# Patient Record
Sex: Female | Born: 1965 | Race: White | Hispanic: No | Marital: Married | Smoking: Never smoker
Health system: Southern US, Community
[De-identification: ages and names within clinical notes are randomized; demographics above are authoritative.]

## PROBLEM LIST (undated history)

## (undated) DIAGNOSIS — M109 Gout, unspecified: Secondary | ICD-10-CM

## (undated) DIAGNOSIS — D649 Anemia, unspecified: Secondary | ICD-10-CM

## (undated) DIAGNOSIS — N61 Mastitis without abscess: Secondary | ICD-10-CM

## (undated) DIAGNOSIS — G5600 Carpal tunnel syndrome, unspecified upper limb: Secondary | ICD-10-CM

## (undated) DIAGNOSIS — N6009 Solitary cyst of unspecified breast: Secondary | ICD-10-CM

## (undated) HISTORY — DX: Solitary cyst of unspecified breast: N60.09

## (undated) HISTORY — DX: Gout, unspecified: M10.9

## (undated) HISTORY — DX: Carpal tunnel syndrome, unspecified upper limb: G56.00

## (undated) HISTORY — DX: Mastitis without abscess: N61.0

---

## 2010-06-20 HISTORY — PX: BREAST CYST ASPIRATION: SHX578

## 2012-11-19 ENCOUNTER — Ambulatory Visit: Payer: Self-pay

## 2012-12-24 ENCOUNTER — Encounter: Payer: Self-pay | Admitting: *Deleted

## 2012-12-25 ENCOUNTER — Ambulatory Visit: Payer: Self-pay | Admitting: General Surgery

## 2012-12-31 ENCOUNTER — Ambulatory Visit (INDEPENDENT_AMBULATORY_CARE_PROVIDER_SITE_OTHER): Payer: BC Managed Care – PPO | Admitting: General Surgery

## 2012-12-31 ENCOUNTER — Encounter: Payer: Self-pay | Admitting: General Surgery

## 2012-12-31 VITALS — BP 100/60 | HR 62 | Resp 13 | Ht 62.0 in | Wt 108.0 lb

## 2012-12-31 DIAGNOSIS — N6001 Solitary cyst of right breast: Secondary | ICD-10-CM

## 2012-12-31 DIAGNOSIS — N6009 Solitary cyst of unspecified breast: Secondary | ICD-10-CM | POA: Insufficient documentation

## 2012-12-31 NOTE — Patient Instructions (Addendum)
Patient to return as needed. 

## 2012-12-31 NOTE — Progress Notes (Addendum)
Patient ID: Patricia Mercado, female   DOB: May 30, 1966, 47 y.o.   MRN: [id number]  Chief Complaint  Patient presents with  . Breast Problem    category 2 mammogram rigth breast mass    HPI Patricia Mercado is a 47 y.o. female who presents for a breast evaluation. The most recent mammogram was done on 11/19/12 with a birad category 2. A right breast ultrasound was performed at that time as well. The patient states she noticed a lump in the right breast last fall. She states she had a mammogram done then and it was normal. She mentioned this lump to Dr. Luella Cook in May 2014 which prompted her mammogram in June 2014. The lump is located in the right upper outer quadrant of the right breast. She denies any pain or tenderness with the breasts.  The patient was previously evaluated in August 2012 in regards to bilateral breast cyst, right at that time measuring up to 1.4 cm and a complex cyst in the left. Cytology on the left breast lesion was benign.  HPI  Past Medical History  Diagnosis Date  . Mastitis   . Breast cyst 2012, 2014    right breast    Past Surgical History  Procedure Laterality Date  . Breast cyst aspiration Left 2012    Family History  Problem Relation Age of Onset  . Cancer Maternal Grandfather     colon    Social History History  Substance Use Topics  . Smoking status: Never Smoker   . Smokeless tobacco: Never Used  . Alcohol Use: No    No Known Allergies  No current outpatient prescriptions on file.   No current facility-administered medications for this visit.    Review of Systems Review of Systems  Constitutional: Negative.   Respiratory: Negative.   Cardiovascular: Negative.     Blood pressure 100/60, pulse 62, resp. rate 13, height 5\' 2"  (1.575 m), weight 108 lb (48.988 kg), last menstrual period 12/15/2012.  Physical Exam Physical Exam  Constitutional: She is oriented to person, place, and time. She appears well-developed and well-nourished.  Neck: No  thyromegaly present.  Cardiovascular: Normal rate, regular rhythm and normal heart sounds.   No murmur heard. Pulmonary/Chest: Effort normal and breath sounds normal. Right breast exhibits mass. Right breast exhibits no inverted nipple, no nipple discharge, no skin change and no tenderness. Left breast exhibits mass. Left breast exhibits no inverted nipple, no nipple discharge, no skin change and no tenderness. Breasts are symmetrical.  2 cm cyst right breast upper outer quadrant.   1 cm area left breast 12 o'clock.   Lymphadenopathy:    She has no cervical adenopathy.    She has no axillary adenopathy.  Neurological: She is alert and oriented to person, place, and time.  Skin: Skin is warm and dry.    Data Reviewed The lateral right breast mammogram dated November 19, 2012 showed dense breast. Well-circumscribed mass in the upper-outer quadrant. Ultrasound showed multiple cysts, the dominant lesion measuring up to 2.6 cm diameter. The cyst in the upper or quadrant of the right breast is larger than on her 2012 exam, but is asymptomatic.  Assessment    Asymptomatic breast cysts.     Plan    The patient is not having any discomfort in her breast, and aspiration is not indicated for these benign lesions. Should she notice pain or experienced sudden change in size, she was encouraged to call for reassessment.        Patricia Mercado,  Patricia Mercado 01/01/2013, 8:47 PM

## 2013-01-01 ENCOUNTER — Encounter: Payer: Self-pay | Admitting: General Surgery

## 2013-04-15 ENCOUNTER — Ambulatory Visit: Payer: Self-pay

## 2014-04-21 ENCOUNTER — Encounter: Payer: Self-pay | Admitting: General Surgery

## 2014-04-22 ENCOUNTER — Ambulatory Visit: Payer: Self-pay

## 2015-04-16 ENCOUNTER — Other Ambulatory Visit: Payer: Self-pay | Admitting: Unknown Physician Specialty

## 2015-04-16 DIAGNOSIS — Z1231 Encounter for screening mammogram for malignant neoplasm of breast: Secondary | ICD-10-CM

## 2015-04-24 ENCOUNTER — Ambulatory Visit
Admission: RE | Admit: 2015-04-24 | Discharge: 2015-04-24 | Disposition: A | Discharge status: Home / Self Care | Payer: BC Managed Care – PPO | Source: Ambulatory Visit | Attending: Unknown Physician Specialty | Admitting: Unknown Physician Specialty

## 2015-04-24 DIAGNOSIS — Z1231 Encounter for screening mammogram for malignant neoplasm of breast: Secondary | ICD-10-CM | POA: Diagnosis present

## 2016-05-20 ENCOUNTER — Other Ambulatory Visit: Payer: Self-pay | Admitting: Advanced Practice Midwife

## 2016-05-20 DIAGNOSIS — Z1231 Encounter for screening mammogram for malignant neoplasm of breast: Secondary | ICD-10-CM

## 2016-06-20 HISTORY — PX: OTHER SURGICAL HISTORY: SHX169

## 2016-06-27 ENCOUNTER — Ambulatory Visit: Payer: BC Managed Care – PPO

## 2016-07-19 ENCOUNTER — Other Ambulatory Visit: Payer: Self-pay | Admitting: Advanced Practice Midwife

## 2016-07-19 ENCOUNTER — Ambulatory Visit
Admission: RE | Admit: 2016-07-19 | Discharge: 2016-07-19 | Disposition: A | Discharge status: Home / Self Care | Payer: BC Managed Care – PPO | Source: Ambulatory Visit | Attending: Advanced Practice Midwife | Admitting: Advanced Practice Midwife

## 2016-07-19 DIAGNOSIS — Z1231 Encounter for screening mammogram for malignant neoplasm of breast: Secondary | ICD-10-CM | POA: Insufficient documentation

## 2017-08-07 ENCOUNTER — Other Ambulatory Visit: Payer: Self-pay | Admitting: Certified Nurse Midwife

## 2017-08-18 ENCOUNTER — Encounter: Payer: Self-pay | Admitting: Advanced Practice Midwife

## 2017-08-18 ENCOUNTER — Ambulatory Visit (INDEPENDENT_AMBULATORY_CARE_PROVIDER_SITE_OTHER): Payer: BC Managed Care – PPO | Admitting: Advanced Practice Midwife

## 2017-08-18 VITALS — BP 100/60 | HR 64 | Ht 63.0 in | Wt 109.0 lb

## 2017-08-18 DIAGNOSIS — Z1231 Encounter for screening mammogram for malignant neoplasm of breast: Secondary | ICD-10-CM | POA: Diagnosis not present

## 2017-08-18 DIAGNOSIS — Z1239 Encounter for other screening for malignant neoplasm of breast: Secondary | ICD-10-CM

## 2017-08-18 DIAGNOSIS — Z0000 Encounter for general adult medical examination without abnormal findings: Secondary | ICD-10-CM

## 2017-08-18 NOTE — Patient Instructions (Signed)
American Heart Association (AHA) Exercise Recommendation  Being physically active is important to prevent heart disease and stroke, the nation's No. 1and No. 5killers. To improve overall cardiovascular health, we suggest at least 150 minutes per week of moderate exercise or 75 minutes per week of vigorous exercise (or a combination of moderate and vigorous activity). Thirty minutes a day, five times a week is an easy goal to remember. You will also experience benefits even if you divide your time into two or three segments of 10 to 15 minutes per day.  For people who would benefit from lowering their blood pressure or cholesterol, we recommend 40 minutes of aerobic exercise of moderate to vigorous intensity three to four times a week to lower the risk for heart attack and stroke.  Physical activity is anything that makes you move your body and burn calories.  This includes things like climbing stairs or playing sports. Aerobic exercises benefit your heart, and include walking, jogging, swimming or biking. Strength and stretching exercises are best for overall stamina and flexibility.  The simplest, positive change you can make to effectively improve your heart health is to start walking. It's enjoyable, free, easy, social and great exercise. A walking program is flexible and boasts high success rates because people can stick with it. It's easy for walking to become a regular and satisfying part of life.   For Overall Cardiovascular Health:  At least 30 minutes of moderate-intensity aerobic activity at least 5 days per week for a total of 150  OR   At least 25 minutes of vigorous aerobic activity at least 3 days per week for a total of 75 minutes; or a combination of moderate- and vigorous-intensity aerobic activity  AND   Moderate- to high-intensity muscle-strengthening activity at least 2 days per week for additional health benefits.  For Lowering Blood Pressure and Cholesterol  An  average 40 minutes of moderate- to vigorous-intensity aerobic activity 3 or 4 times per week  What if I can't make it to the time goal? Something is always better than nothing! And everyone has to start somewhere. Even if you've been sedentary for years, today is the day you can begin to make healthy changes in your life. If you don't think you'll make it for 30 or 40 minutes, set a reachable goal for today. You can work up toward your overall goal by increasing your time as you get stronger. Don't let all-or-nothing thinking rob you of doing what you can every day.  Source:http://www.heart.org   Mediterranean Diet A Mediterranean diet refers to food and lifestyle choices that are based on the traditions of countries located on the [employer]. This way of eating has been shown to help prevent certain conditions and improve outcomes for people who have chronic diseases, like kidney disease and heart disease. What are tips for following this plan? Lifestyle  Cook and eat meals together with your family, when possible.  Drink enough fluid to keep your urine clear or pale yellow.  Be physically active every day. This includes: ? Aerobic exercise like running or swimming. ? Leisure activities like gardening, walking, or housework.  Get 7-8 hours of sleep each night.  If recommended by your health care provider, drink red wine in moderation. This means 1 glass a day for nonpregnant women and 2 glasses a day for men. A glass of wine equals 5 oz (150 mL). Reading food labels  Check the serving size of packaged foods. For foods such as rice  and pasta, the serving size refers to the amount of cooked product, not dry.  Check the total fat in packaged foods. Avoid foods that have saturated fat or trans fats.  Check the ingredients list for added sugars, such as corn syrup. Shopping  At the grocery store, buy most of your food from the areas near the walls of the store. This  includes: ? Fresh fruits and vegetables (produce). ? Grains, beans, nuts, and seeds. Some of these may be available in unpackaged forms or large amounts (in bulk). ? Fresh seafood. ? Poultry and eggs. ? Low-fat dairy products.  Buy whole ingredients instead of prepackaged foods.  Buy fresh fruits and vegetables in-season from local farmers markets.  Buy frozen fruits and vegetables in resealable bags.  If you do not have access to quality fresh seafood, buy precooked frozen shrimp or canned fish, such as tuna, salmon, or sardines.  Buy small amounts of raw or cooked vegetables, salads, or olives from the deli or salad bar at your store.  Stock your pantry so you always have certain foods on hand, such as olive oil, canned tuna, canned tomatoes, rice, pasta, and beans. Cooking  Cook foods with extra-virgin olive oil instead of using butter or other vegetable oils.  Have meat as a side dish, and have vegetables or grains as your main dish. This means having meat in small portions or adding small amounts of meat to foods like pasta or stew.  Use beans or vegetables instead of meat in common dishes like chili or lasagna.  Experiment with different cooking methods. Try roasting or broiling vegetables instead of steaming or sauteing them.  Add frozen vegetables to soups, stews, pasta, or rice.  Add nuts or seeds for added healthy fat at each meal. You can add these to yogurt, salads, or vegetable dishes.  Marinate fish or vegetables using olive oil, lemon juice, garlic, and fresh herbs. Meal planning  Plan to eat 1 vegetarian meal one day each week. Try to work up to 2 vegetarian meals, if possible.  Eat seafood 2 or more times a week.  Have healthy snacks readily available, such as: ? Vegetable sticks with hummus. ? [place] yogurt. ? Fruit and nut trail mix.  Eat balanced meals throughout the week. This includes: ? Fruit: 2-3 servings a day ? Vegetables: 4-5 servings a  day ? Low-fat dairy: 2 servings a day ? Fish, poultry, or lean meat: 1 serving a day ? Beans and legumes: 2 or more servings a week ? Nuts and seeds: 1-2 servings a day ? Whole grains: 6-8 servings a day ? Extra-virgin olive oil: 3-4 servings a day  Limit red meat and sweets to only a few servings a month What are my food choices?  Mediterranean diet ? Recommended ? Grains: Whole-grain pasta. Brown rice. Bulgar wheat. Polenta. Couscous. Whole-wheat bread. [place]. ? Vegetables: Artichokes. Beets. Broccoli. Cabbage. Carrots. Eggplant. Green beans. Chard. Kale. Spinach. Onions. Leeks. Peas. Squash. Tomatoes. Peppers. Radishes. ? Fruits: Apples. Apricots. Avocado. Berries. Bananas. Cherries. Dates. Figs. Grapes. Lemons. Melon. Oranges. Peaches. Plums. Pomegranate. ? Meats and other protein foods: Beans. Almonds. Sunflower seeds. Pine nuts. Peanuts. [place]. Salmon. Scallops. Shrimp. [place]. Tilapia. Clams. Oysters. Eggs. ? Dairy: Low-fat milk. Cheese. Greek yogurt. ? Beverages: Water. Red wine. Herbal tea. ? Fats and oils: Extra virgin olive oil. Avocado oil. Grape seed oil. ? Sweets and desserts: [place] yogurt with honey. Baked apples. Poached pears. Trail mix. ? Seasoning and other foods: Basil. Cilantro. Coriander.  Cumin. Mint. Parsley. Sage. Rosemary. Tarragon. Garlic. Oregano. Thyme. Pepper. Balsalmic vinegar. Tahini. Hummus. Tomato sauce. Olives. Mushrooms. ? Limit these ? Grains: Prepackaged pasta or rice dishes. Prepackaged cereal with added sugar. ? Vegetables: Deep fried potatoes (french fries). ? Fruits: Fruit canned in syrup. ? Meats and other protein foods: Beef. Pork. Lamb. Poultry with skin. Hot dogs. Bacon. ? Dairy: Ice cream. Sour cream. Whole milk. ? Beverages: Juice. Sugar-sweetened soft drinks. Beer. Liquor and spirits. ? Fats and oils: Butter. Canola oil. Vegetable oil. Beef fat (tallow). Lard. ? Sweets and desserts: Cookies. Cakes. Pies. Candy. ? Seasoning and other  foods: Mayonnaise. Premade sauces and marinades. ? The items listed may not be a complete list. Talk with your dietitian about what dietary choices are right for you. Summary  The Mediterranean diet includes both food and lifestyle choices.  Eat a variety of fresh fruits and vegetables, beans, nuts, seeds, and whole grains.  Limit the amount of red meat and sweets that you eat.  Talk with your health care provider about whether it is safe for you to drink red wine in moderation. This means 1 glass a day for nonpregnant women and 2 glasses a day for men. A glass of wine equals 5 oz (150 mL). This information is not intended to replace advice given to you by your health care provider. Make sure you discuss any questions you have with your health care provider. Document Released: 01/28/2016 Document Revised: 03/01/2016 Document Reviewed: 01/28/2016 Elsevier Interactive Patient Education  2018 [employer] Health Maintenance, Female Adopting a healthy lifestyle and getting preventive care can go a long way to promote health and wellness. Talk with your health care provider about what schedule of regular examinations is right for you. This is a good chance for you to check in with your provider about disease prevention and staying healthy. In between checkups, there are plenty of things you can do on your own. Experts have done a lot of research about which lifestyle changes and preventive measures are most likely to keep you healthy. Ask your health care provider for more information. Weight and diet Eat a healthy diet  Be sure to include plenty of vegetables, fruits, low-fat dairy products, and lean protein.  Do not eat a lot of foods high in solid fats, added sugars, or salt.  Get regular exercise. This is one of the most important things you can do for your health. ? Most adults should exercise for at least 150 minutes each week. The exercise should increase your heart rate and make you  sweat (moderate-intensity exercise). ? Most adults should also do strengthening exercises at least twice a week. This is in addition to the moderate-intensity exercise.  Maintain a healthy weight  Body mass index (BMI) is a measurement that can be used to identify possible weight problems. It estimates body fat based on height and weight. Your health care provider can help determine your BMI and help you achieve or maintain a healthy weight.  For females 20 years of age and older: ? A BMI below 18.5 is considered underweight. ? A BMI of 18.5 to 24.9 is normal. ? A BMI of 25 to 29.9 is considered overweight. ? A BMI of 30 and above is considered obese.  Watch levels of cholesterol and blood lipids  You should start having your blood tested for lipids and cholesterol at 50 years of age, then have this test every 5 years.  You may need to have your cholesterol levels   checked more often if: ? Your lipid or cholesterol levels are high. ? You are older than 50 years of age. ? You are at high risk for heart disease.  Cancer screening Lung Cancer  Lung cancer screening is recommended for adults 34-81 years old who are at high risk for lung cancer because of a history of smoking.  A yearly low-dose CT scan of the lungs is recommended for people who: ? Currently smoke. ? Have quit within the past 15 years. ? Have at least a 30-pack-year history of smoking. A pack year is smoking an average of one pack of cigarettes a day for 1 year.  Yearly screening should continue until it has been 15 years since you quit.  Yearly screening should stop if you develop a health problem that would prevent you from having lung cancer treatment.  Breast Cancer  Practice breast self-awareness. This means understanding how your breasts normally appear and feel.  It also means doing regular breast self-exams. Let your health care provider know about any changes, no matter how small.  If you are in your 20s  or 30s, you should have a clinical breast exam (CBE) by a health care provider every 1-3 years as part of a regular health exam.  If you are 62 or older, have a CBE every year. Also consider having a breast X-ray (mammogram) every year.  If you have a family history of breast cancer, talk to your health care provider about genetic screening.  If you are at high risk for breast cancer, talk to your health care provider about having an MRI and a mammogram every year.  Breast cancer gene (BRCA) assessment is recommended for women who have family members with BRCA-related cancers. BRCA-related cancers include: ? Breast. ? Ovarian. ? Tubal. ? Peritoneal cancers.  Results of the assessment will determine the need for genetic counseling and BRCA1 and BRCA2 testing.  Cervical Cancer Your health care provider may recommend that you be screened regularly for cancer of the pelvic organs (ovaries, uterus, and vagina). This screening involves a pelvic examination, including checking for microscopic changes to the surface of your cervix (Pap test). You may be encouraged to have this screening done every 3 years, beginning at age 8.  For women ages 52-65, health care providers may recommend pelvic exams and Pap testing every 3 years, or they may recommend the Pap and pelvic exam, combined with testing for human papilloma virus (HPV), every 5 years. Some types of HPV increase your risk of cervical cancer. Testing for HPV may also be done on women of any age with unclear Pap test results.  Other health care providers may not recommend any screening for nonpregnant women who are considered low risk for pelvic cancer and who do not have symptoms. Ask your health care provider if a screening pelvic exam is right for you.  If you have had past treatment for cervical cancer or a condition that could lead to cancer, you need Pap tests and screening for cancer for at least 20 years after your treatment. If Pap tests  have been discontinued, your risk factors (such as having a new sexual partner) need to be reassessed to determine if screening should resume. Some women have medical problems that increase the chance of getting cervical cancer. In these cases, your health care provider may recommend more frequent screening and Pap tests.  Colorectal Cancer  This type of cancer can be detected and often prevented.  Routine colorectal cancer screening  usually begins at 50 years of age and continues through 50 years of age.  Your health care provider may recommend screening at an earlier age if you have risk factors for colon cancer.  Your health care provider may also recommend using home test kits to check for hidden blood in the stool.  A small camera at the end of a tube can be used to examine your colon directly (sigmoidoscopy or colonoscopy). This is done to check for the earliest forms of colorectal cancer.  Routine screening usually begins at age 8.  Direct examination of the colon should be repeated every 5-10 years through 50 years of age. However, you may need to be screened more often if early forms of precancerous polyps or small growths are found.  Skin Cancer  Check your skin from head to toe regularly.  Tell your health care provider about any new moles or changes in moles, especially if there is a change in a mole's shape or color.  Also tell your health care provider if you have a mole that is larger than the size of a pencil eraser.  Always use sunscreen. Apply sunscreen liberally and repeatedly throughout the day.  Protect yourself by wearing long sleeves, pants, a wide-brimmed hat, and sunglasses whenever you are outside.  Heart disease, diabetes, and high blood pressure  High blood pressure causes heart disease and increases the risk of stroke. High blood pressure is more likely to develop in: ? People who have blood pressure in the high end of the normal range (130-139/85-89 mm  Hg). ? People who are overweight or obese. ? People who are African American.  If you are 29-23 years of age, have your blood pressure checked every 3-5 years. If you are 68 years of age or older, have your blood pressure checked every year. You should have your blood pressure measured twice-once when you are at a hospital or clinic, and once when you are not at a hospital or clinic. Record the average of the two measurements. To check your blood pressure when you are not at a hospital or clinic, you can use: ? An automated blood pressure machine at a pharmacy. ? A home blood pressure monitor.  If you are between 82 years and 71 years old, ask your health care provider if you should take aspirin to prevent strokes.  Have regular diabetes screenings. This involves taking a blood sample to check your fasting blood sugar level. ? If you are at a normal weight and have a low risk for diabetes, have this test once every three years after 50 years of age. ? If you are overweight and have a high risk for diabetes, consider being tested at a younger age or more often. Preventing infection Hepatitis B  If you have a higher risk for hepatitis B, you should be screened for this virus. You are considered at high risk for hepatitis B if: ? You were born in a country where hepatitis B is common. Ask your health care provider which countries are considered high risk. ? Your parents were born in a high-risk country, and you have not been immunized against hepatitis B (hepatitis B vaccine). ? You have HIV or AIDS. ? You use needles to inject street drugs. ? You live with someone who has hepatitis B. ? You have had sex with someone who has hepatitis B. ? You get hemodialysis treatment. ? You take certain medicines for conditions, including cancer, organ transplantation, and autoimmune conditions.  Hepatitis C  Blood testing is recommended for: ? Everyone born from [birth year] through 1963. ? Anyone with known  risk factors for hepatitis C.  Sexually transmitted infections (STIs)  You should be screened for sexually transmitted infections (STIs) including gonorrhea and chlamydia if: ? You are sexually active and are younger than 50 years of age. ? You are older than 50 years of age and your health care provider tells you that you are at risk for this type of infection. ? Your sexual activity has changed since you were last screened and you are at an increased risk for chlamydia or gonorrhea. Ask your health care provider if you are at risk.  If you do not have HIV, but are at risk, it may be recommended that you take a prescription medicine daily to prevent HIV infection. This is called pre-exposure prophylaxis (PrEP). You are considered at risk if: ? You are sexually active and do not regularly use condoms or know the HIV status of your partner(s). ? You take drugs by injection. ? You are sexually active with a partner who has HIV.  Talk with your health care provider about whether you are at high risk of being infected with HIV. If you choose to begin PrEP, you should first be tested for HIV. You should then be tested every 3 months for as long as you are taking PrEP. Pregnancy  If you are premenopausal and you may become pregnant, ask your health care provider about preconception counseling.  If you may become pregnant, take 400 to 800 micrograms (mcg) of folic acid every day.  If you want to prevent pregnancy, talk to your health care provider about birth control (contraception). Osteoporosis and menopause  Osteoporosis is a disease in which the bones lose minerals and strength with aging. This can result in serious bone fractures. Your risk for osteoporosis can be identified using a bone density scan.  If you are 74 years of age or older, or if you are at risk for osteoporosis and fractures, ask your health care provider if you should be screened.  Ask your health care provider whether you  should take a calcium or vitamin D supplement to lower your risk for osteoporosis.  Menopause may have certain physical symptoms and risks.  Hormone replacement therapy may reduce some of these symptoms and risks. Talk to your health care provider about whether hormone replacement therapy is right for you. Follow these instructions at home:  Schedule regular health, dental, and eye exams.  Stay current with your immunizations.  Do not use any tobacco products including cigarettes, chewing tobacco, or electronic cigarettes.  If you are pregnant, do not drink alcohol.  If you are breastfeeding, limit how much and how often you drink alcohol.  Limit alcohol intake to no more than 1 drink per day for nonpregnant women. One drink equals 12 ounces of beer, 5 ounces of wine, or 1 ounces of hard liquor.  Do not use street drugs.  Do not share needles.  Ask your health care provider for help if you need support or information about quitting drugs.  Tell your health care provider if you often feel depressed.  Tell your health care provider if you have ever been abused or do not feel safe at home. This information is not intended to replace advice given to you by your health care provider. Make sure you discuss any questions you have with your health care provider. Document Released: 12/20/2010 Document Revised: 11/12/2015 Document Reviewed:  03/10/2015 Elsevier Interactive Patient Education  [employer].

## 2017-08-18 NOTE — Progress Notes (Signed)
Patient ID: [name], female   DOB: 1965/10/15, 52 y.o.   MRN: [id number]    Gynecology Annual Exam  PCP: Patricia Downer, NP  Chief Complaint:  Chief Complaint  Patient presents with  . Gynecologic Exam    History of Present Illness: Patient is a 52 y.o. [id number] presents for annual exam. The patient has no complaints today.   LMP: Patient's last menstrual period was 08/04/2017. Average Interval: regular, 28 days Duration of flow: 4 days Heavy Menses: no Clots: no Intermenstrual Bleeding: no Postcoital Bleeding: no Dysmenorrhea: no   The patient is sexually active. She currently uses condoms for contraception. She denies dyspareunia.  The patient does occasionally perform self breast exams.  There is no notable family history of breast or ovarian cancer in her family.  The patient wears seatbelts: yes.   The patient has regular exercise: no.  She occasionally walks. She admits healthy diet. She drinks diet soda more than water. She admits good social support system.  The patient denies current symptoms of depression.    Review of Systems: Review of Systems  Constitutional: Negative.   HENT: Negative.   Eyes: Negative.   Respiratory: Negative.   Cardiovascular: Negative.   Gastrointestinal: Negative.   Genitourinary: Negative.   Musculoskeletal: Negative.   Skin: Negative.   Neurological: Negative.   Endo/Heme/Allergies: Negative.   Psychiatric/Behavioral: Negative.     Past Medical History:  Past Medical History:  Diagnosis Date  . Breast cyst 2012, 2014   right breast  . Carpal tunnel syndrome   . Mastitis     Past Surgical History:  Past Surgical History:  Procedure Laterality Date  . BREAST CYST ASPIRATION Left 2012    Gynecologic History:  Patient's last menstrual period was 08/04/2017. Contraception: condoms Last Pap: 2 years ago Results were:  no abnormalities  Last mammogram: 1 year ago Results were: BI-RAD I  Obstetric History:  [id number]  Family History:  Family History  Problem Relation Age of Onset  . Cancer Maternal Grandfather        colon  . Prostate cancer Father   . Breast cancer Neg Hx     Social History:  Social History   Socioeconomic History  . Marital status: Married    Spouse name: Not on file  . Number of children: Not on file  . Years of education: Not on file  . Highest education level: Not on file  Social Needs  . Financial resource strain: Not on file  . Food insecurity - worry: Not on file  . Food insecurity - inability: Not on file  . Transportation needs - medical: Not on file  . Transportation needs - non-medical: Not on file  Occupational History  . Not on file  Tobacco Use  . Smoking status: Never Smoker  . Smokeless tobacco: Never Used  Substance and Sexual Activity  . Alcohol use: No  . Drug use: No  . Sexual activity: Yes    Birth control/protection: Condom  Other Topics Concern  . Not on file  Social History Narrative  . Not on file    Allergies:  No Known Allergies  Medications: Prior to Admission medications   Not on File    Physical Exam Vitals: Blood pressure 100/60, pulse 64, height 5\' 3"  (1.6 m), weight 109 lb (49.4 kg), last menstrual period 08/04/2017.  General: NAD HEENT: normocephalic, anicteric Thyroid: no enlargement, no palpable nodules Pulmonary: No increased work of breathing, CTAB Cardiovascular: RRR, distal pulses 2+ Breast:  Breast symmetrical, no tenderness, no palpable nodules or masses, no skin or nipple retraction present, no nipple discharge.  No axillary or supraclavicular lymphadenopathy. Abdomen: NABS, soft, non-tender, non-distended.  Umbilicus without lesions.  No hepatomegaly, splenomegaly or masses palpable. No evidence of hernia  Genitourinary: shared decision making deferred for no symptoms and PAP screening interval   Extremities: no edema, erythema, or tenderness Neurologic: Grossly intact Psychiatric: mood  appropriate, affect full   Assessment: 52 y.o. [id number] routine annual exam  Plan: Problem List Items Addressed This Visit    None    Visit Diagnoses    Well woman exam without gynecological exam    -  Primary   Relevant Orders   MM DIGITAL SCREENING BILATERAL   Screening for breast cancer       Relevant Orders   MM DIGITAL SCREENING BILATERAL      1) Mammogram - recommend yearly screening mammogram.  Mammogram Was ordered today   2) STI screening  wasoffered and declined  3) ASCCP guidelines and rational discussed.  Patient opts for every 3 years screening interval  4) Contraception - the patient is currently using  condoms.  She is happy with her current form of contraception and plans to continue  5) Colonoscopy -- Screening recommended starting at age 50 for average risk individuals, age 50 for individuals deemed at increased risk (including African Americans) and recommended to continue until age 50.  For patient age 50-85 individualized approach is recommended.  Gold standard screening is via colonoscopy, Cologuard screening is an acceptable alternative for patient unwilling or unable to undergo colonoscopy.  "Colorectal cancer screening for average?risk adults: 2018 guideline update from the American Cancer Society"[place]: A Cancer Journal for Clinicians: Nov 16, 2016   6) Routine healthcare maintenance including cholesterol, diabetes screening discussed Declines   7) Increase healthy lifestyle exercise and h2o intake  8) Return in 1 year (on 08/19/2018) for annual established gyn.   Patricia Mercado, CNM [facility], [facility] 08/18/2017, 9:48 AM

## 2017-09-29 ENCOUNTER — Ambulatory Visit
Admission: RE | Admit: 2017-09-29 | Discharge: 2017-09-29 | Disposition: A | Discharge status: Home / Self Care | Payer: BC Managed Care – PPO | Source: Ambulatory Visit | Attending: Advanced Practice Midwife | Admitting: Advanced Practice Midwife

## 2017-09-29 ENCOUNTER — Other Ambulatory Visit: Payer: Self-pay | Admitting: Advanced Practice Midwife

## 2017-09-29 DIAGNOSIS — Z0000 Encounter for general adult medical examination without abnormal findings: Secondary | ICD-10-CM

## 2017-09-29 DIAGNOSIS — N6489 Other specified disorders of breast: Secondary | ICD-10-CM

## 2017-09-29 DIAGNOSIS — R928 Other abnormal and inconclusive findings on diagnostic imaging of breast: Secondary | ICD-10-CM

## 2017-09-29 DIAGNOSIS — Z1231 Encounter for screening mammogram for malignant neoplasm of breast: Secondary | ICD-10-CM | POA: Diagnosis present

## 2017-09-29 DIAGNOSIS — N6320 Unspecified lump in the left breast, unspecified quadrant: Secondary | ICD-10-CM

## 2017-09-29 DIAGNOSIS — Z1239 Encounter for other screening for malignant neoplasm of breast: Secondary | ICD-10-CM

## 2017-10-06 ENCOUNTER — Ambulatory Visit
Admission: RE | Admit: 2017-10-06 | Discharge: 2017-10-06 | Disposition: A | Discharge status: Home / Self Care | Payer: BC Managed Care – PPO | Source: Ambulatory Visit | Attending: Advanced Practice Midwife | Admitting: Advanced Practice Midwife

## 2017-10-06 DIAGNOSIS — N6320 Unspecified lump in the left breast, unspecified quadrant: Secondary | ICD-10-CM

## 2017-10-06 DIAGNOSIS — N6489 Other specified disorders of breast: Secondary | ICD-10-CM

## 2017-10-06 DIAGNOSIS — R928 Other abnormal and inconclusive findings on diagnostic imaging of breast: Secondary | ICD-10-CM

## 2018-08-23 ENCOUNTER — Other Ambulatory Visit (HOSPITAL_COMMUNITY)
Admission: RE | Admit: 2018-08-23 | Discharge: 2018-08-23 | Disposition: A | Discharge status: Home / Self Care | Payer: BC Managed Care – PPO | Source: Ambulatory Visit | Attending: Advanced Practice Midwife | Admitting: Advanced Practice Midwife

## 2018-08-23 ENCOUNTER — Encounter: Payer: Self-pay | Admitting: Advanced Practice Midwife

## 2018-08-23 ENCOUNTER — Ambulatory Visit (INDEPENDENT_AMBULATORY_CARE_PROVIDER_SITE_OTHER): Payer: BC Managed Care – PPO | Admitting: Advanced Practice Midwife

## 2018-08-23 VITALS — BP 114/70 | Ht 62.0 in | Wt 110.0 lb

## 2018-08-23 DIAGNOSIS — Z01419 Encounter for gynecological examination (general) (routine) without abnormal findings: Secondary | ICD-10-CM | POA: Diagnosis present

## 2018-08-23 DIAGNOSIS — Z113 Encounter for screening for infections with a predominantly sexual mode of transmission: Secondary | ICD-10-CM | POA: Diagnosis present

## 2018-08-23 DIAGNOSIS — Z1211 Encounter for screening for malignant neoplasm of colon: Secondary | ICD-10-CM

## 2018-08-23 NOTE — Patient Instructions (Signed)
Health Maintenance, Female Adopting a healthy lifestyle and getting preventive care can go a long way to promote health and wellness. Talk with your health care provider about what schedule of regular examinations is right for you. This is a good chance for you to check in with your provider about disease prevention and staying healthy. In between checkups, there are plenty of things you can do on your own. Experts have done a lot of research about which lifestyle changes and preventive measures are most likely to keep you healthy. Ask your health care provider for more information. Weight and diet Eat a healthy diet  Be sure to include plenty of vegetables, fruits, low-fat dairy products, and lean protein.  Do not eat a lot of foods high in solid fats, added sugars, or salt.  Get regular exercise. This is one of the most important things you can do for your health. ? Most adults should exercise for at least 150 minutes each week. The exercise should increase your heart rate and make you sweat (moderate-intensity exercise). ? Most adults should also do strengthening exercises at least twice a week. This is in addition to the moderate-intensity exercise. Maintain a healthy weight  Body mass index (BMI) is a measurement that can be used to identify possible weight problems. It estimates body fat based on height and weight. Your health care provider can help determine your BMI and help you achieve or maintain a healthy weight.  For females 5 years of age and older: ? A BMI below 18.5 is considered underweight. ? A BMI of 18.5 to 24.9 is normal. ? A BMI of 25 to 29.9 is considered overweight. ? A BMI of 30 and above is considered obese. Watch levels of cholesterol and blood lipids  You should start having your blood tested for lipids and cholesterol at 50 years of age, then have this test every 5 years.  You may need to have your cholesterol levels checked more often if: ? Your lipid or  cholesterol levels are high. ? You are older than 51 years of age. ? You are at high risk for heart disease. Cancer screening Lung Cancer  Lung cancer screening is recommended for adults 48-79 years old who are at high risk for lung cancer because of a history of smoking.  A yearly low-dose CT scan of the lungs is recommended for people who: ? Currently smoke. ? Have quit within the past 15 years. ? Have at least a 30-pack-year history of smoking. A pack year is smoking an average of one pack of cigarettes a day for 1 year.  Yearly screening should continue until it has been 15 years since you quit.  Yearly screening should stop if you develop a health problem that would prevent you from having lung cancer treatment. Breast Cancer  Practice breast self-awareness. This means understanding how your breasts normally appear and feel.  It also means doing regular breast self-exams. Let your health care provider know about any changes, no matter how small.  If you are in your 20s or 30s, you should have a clinical breast exam (CBE) by a health care provider every 1-3 years as part of a regular health exam.  If you are 22 or older, have a CBE every year. Also consider having a breast X-ray (mammogram) every year.  If you have a family history of breast cancer, talk to your health care provider about genetic screening.  If you are at high risk for breast cancer, talk  to your health care provider about having an MRI and a mammogram every year.  Breast cancer gene (BRCA) assessment is recommended for women who have family members with BRCA-related cancers. BRCA-related cancers include: ? Breast. ? Ovarian. ? Tubal. ? Peritoneal cancers.  Results of the assessment will determine the need for genetic counseling and BRCA1 and BRCA2 testing. Cervical Cancer Your health care provider may recommend that you be screened regularly for cancer of the pelvic organs (ovaries, uterus, and vagina).  This screening involves a pelvic examination, including checking for microscopic changes to the surface of your cervix (Pap test). You may be encouraged to have this screening done every 3 years, beginning at age 21.  For women ages 30-65, health care providers may recommend pelvic exams and Pap testing every 3 years, or they may recommend the Pap and pelvic exam, combined with testing for human papilloma virus (HPV), every 5 years. Some types of HPV increase your risk of cervical cancer. Testing for HPV may also be done on women of any age with unclear Pap test results.  Other health care providers may not recommend any screening for nonpregnant women who are considered low risk for pelvic cancer and who do not have symptoms. Ask your health care provider if a screening pelvic exam is right for you.  If you have had past treatment for cervical cancer or a condition that could lead to cancer, you need Pap tests and screening for cancer for at least 20 years after your treatment. If Pap tests have been discontinued, your risk factors (such as having a new sexual partner) need to be reassessed to determine if screening should resume. Some women have medical problems that increase the chance of getting cervical cancer. In these cases, your health care provider may recommend more frequent screening and Pap tests. Colorectal Cancer  This type of cancer can be detected and often prevented.  Routine colorectal cancer screening usually begins at 50 years of age and continues through 51 years of age.  Your health care provider may recommend screening at an earlier age if you have risk factors for colon cancer.  Your health care provider may also recommend using home test kits to check for hidden blood in the stool.  A small camera at the end of a tube can be used to examine your colon directly (sigmoidoscopy or colonoscopy). This is done to check for the earliest forms of colorectal cancer.  Routine  screening usually begins at age 50.  Direct examination of the colon should be repeated every 5-10 years through 51 years of age. However, you may need to be screened more often if early forms of precancerous polyps or small growths are found. Skin Cancer  Check your skin from head to toe regularly.  Tell your health care provider about any new moles or changes in moles, especially if there is a change in a mole's shape or color.  Also tell your health care provider if you have a mole that is larger than the size of a pencil eraser.  Always use sunscreen. Apply sunscreen liberally and repeatedly throughout the day.  Protect yourself by wearing long sleeves, pants, a wide-brimmed hat, and sunglasses whenever you are outside. Heart disease, diabetes, and high blood pressure  High blood pressure causes heart disease and increases the risk of stroke. High blood pressure is more likely to develop in: ? People who have blood pressure in the high end of the normal range (130-139/85-89 mm Hg). ? People   who are overweight or obese. ? People who are African American.  If you are 84-22 years of age, have your blood pressure checked every 3-5 years. If you are 67 years of age or older, have your blood pressure checked every year. You should have your blood pressure measured twice-once when you are at a hospital or clinic, and once when you are not at a hospital or clinic. Record the average of the two measurements. To check your blood pressure when you are not at a hospital or clinic, you can use: ? An automated blood pressure machine at a pharmacy. ? A home blood pressure monitor.  If you are between 52 years and 3 years old, ask your health care provider if you should take aspirin to prevent strokes.  Have regular diabetes screenings. This involves taking a blood sample to check your fasting blood sugar level. ? If you are at a normal weight and have a low risk for diabetes, have this test once  every three years after 51 years of age. ? If you are overweight and have a high risk for diabetes, consider being tested at a younger age or more often. Preventing infection Hepatitis B  If you have a higher risk for hepatitis B, you should be screened for this virus. You are considered at high risk for hepatitis B if: ? You were born in a country where hepatitis B is common. Ask your health care provider which countries are considered high risk. ? Your parents were born in a high-risk country, and you have not been immunized against hepatitis B (hepatitis B vaccine). ? You have HIV or AIDS. ? You use needles to inject street drugs. ? You live with someone who has hepatitis B. ? You have had sex with someone who has hepatitis B. ? You get hemodialysis treatment. ? You take certain medicines for conditions, including cancer, organ transplantation, and autoimmune conditions. Hepatitis C  Blood testing is recommended for: ? Everyone born from [birth year] through 1963. ? Anyone with known risk factors for hepatitis C. Sexually transmitted infections (STIs)  You should be screened for sexually transmitted infections (STIs) including gonorrhea and chlamydia if: ? You are sexually active and are younger than 51 years of age. ? You are older than 51 years of age and your health care provider tells you that you are at risk for this type of infection. ? Your sexual activity has changed since you were last screened and you are at an increased risk for chlamydia or gonorrhea. Ask your health care provider if you are at risk.  If you do not have HIV, but are at risk, it may be recommended that you take a prescription medicine daily to prevent HIV infection. This is called pre-exposure prophylaxis (PrEP). You are considered at risk if: ? You are sexually active and do not regularly use condoms or know the HIV status of your partner(s). ? You take drugs by injection. ? You are sexually active with a partner  who has HIV. Talk with your health care provider about whether you are at high risk of being infected with HIV. If you choose to begin PrEP, you should first be tested for HIV. You should then be tested every 3 months for as long as you are taking PrEP. Pregnancy  If you are premenopausal and you may become pregnant, ask your health care provider about preconception counseling.  If you may become pregnant, take 400 to 800 micrograms (mcg) of folic acid every  day.  If you want to prevent pregnancy, talk to your health care provider about birth control (contraception). Osteoporosis and menopause  Osteoporosis is a disease in which the bones lose minerals and strength with aging. This can result in serious bone fractures. Your risk for osteoporosis can be identified using a bone density scan.  If you are 60 years of age or older, or if you are at risk for osteoporosis and fractures, ask your health care provider if you should be screened.  Ask your health care provider whether you should take a calcium or vitamin D supplement to lower your risk for osteoporosis.  Menopause may have certain physical symptoms and risks.  Hormone replacement therapy may reduce some of these symptoms and risks. Talk to your health care provider about whether hormone replacement therapy is right for you. Follow these instructions at home:  Schedule regular health, dental, and eye exams.  Stay current with your immunizations.  Do not use any tobacco products including cigarettes, chewing tobacco, or electronic cigarettes.  If you are pregnant, do not drink alcohol.  If you are breastfeeding, limit how much and how often you drink alcohol.  Limit alcohol intake to no more than 1 drink per day for nonpregnant women. One drink equals 12 ounces of beer, 5 ounces of wine, or 1 ounces of hard liquor.  Do not use street drugs.  Do not share needles.  Ask your health care provider for help if you need support  or information about quitting drugs.  Tell your health care provider if you often feel depressed.  Tell your health care provider if you have ever been abused or do not feel safe at home. This information is not intended to replace advice given to you by your health care provider. Make sure you discuss any questions you have with your health care provider. Document Released: 12/20/2010 Document Revised: 11/12/2015 Document Reviewed: 03/10/2015 Elsevier Interactive Patient Education  2019 [employer].     Why follow it? Research shows. . Those who follow the Mediterranean diet have a reduced risk of heart disease  . The diet is associated with a reduced incidence of Parkinson's and Alzheimer's diseases . People following the diet may have longer life expectancies and lower rates of chronic diseases  . The Dietary Guidelines for Americans recommends the Mediterranean diet as an eating plan to promote health and prevent disease  What Is the Mediterranean Diet?  . Healthy eating plan based on typical foods and recipes of Mediterranean-style cooking . The diet is primarily a plant based diet; these foods should make up a majority of meals   Starches - Plant based foods should make up a majority of meals - They are an important sources of vitamins, minerals, energy, antioxidants, and fiber - Choose whole grains, foods high in fiber and minimally processed items  - Typical grain sources include wheat, oats, barley, corn, brown rice, bulgar, farro, millet, polenta, couscous  - Various types of beans include chickpeas, lentils, fava beans, black beans, white beans   Fruits  Veggies - Large quantities of antioxidant rich fruits & veggies; 6 or more servings  - Vegetables can be eaten raw or lightly drizzled with oil and cooked  - Vegetables common to the traditional Mediterranean Diet include: artichokes, arugula, beets, broccoli, brussel sprouts, cabbage, carrots, celery, collard greens,  cucumbers, eggplant, kale, leeks, lemons, lettuce, mushrooms, okra, onions, peas, peppers, potatoes, pumpkin, radishes, rutabaga, shallots, spinach, sweet potatoes, turnips, zucchini - Fruits common to the  Mediterranean Diet include: apples, apricots, avocados, cherries, clementines, dates, figs, grapefruits, grapes, melons, nectarines, oranges, peaches, pears, pomegranates, strawberries, tangerines  Fats - Replace butter and margarine with healthy oils, such as olive oil, canola oil, and tahini  - Limit nuts to no more than a handful a day  - Nuts include walnuts, almonds, pecans, pistachios, pine nuts  - Limit or avoid candied, honey roasted or heavily salted nuts - Olives are central to the [employer] - can be eaten whole or used in a variety of dishes   Meats Protein - Limiting red meat: no more than a few times a month - When eating red meat: choose lean cuts and keep the portion to the size of deck of cards - Eggs: approx. 0 to 4 times a week  - Fish and lean poultry: at least 2 a week  - Healthy protein sources include, chicken, [place], lean beef, lamb - Increase intake of seafood such as tuna, salmon, trout, mackerel, shrimp, scallops - Avoid or limit high fat processed meats such as sausage and bacon  Dairy - Include moderate amounts of low fat dairy products  - Focus on healthy dairy such as fat free yogurt, skim milk, low or reduced fat cheese - Limit dairy products higher in fat such as whole or 2% milk, cheese, ice cream  Alcohol - Moderate amounts of red wine is ok  - No more than 5 oz daily for women (all ages) and men older than age 14  - No more than 10 oz of wine daily for men younger than 30  Other - Limit sweets and other desserts  - Use herbs and spices instead of salt to flavor foods  - Herbs and spices common to the traditional Mediterranean Diet include: basil, bay leaves, chives, cloves, cumin, fennel, garlic, lavender, marjoram, mint, oregano, parsley, pepper,  rosemary, sage, savory, sumac, tarragon, thyme   It's not just a diet, it's a lifestyle:  . The Mediterranean diet includes lifestyle factors typical of those in the region  . Foods, drinks and meals are best eaten with others and savored . Daily physical activity is important for overall good health . This could be strenuous exercise like running and aerobics . This could also be more leisurely activities such as walking, housework, yard-work, or taking the stairs . Moderation is the key; a balanced and healthy diet accommodates most foods and drinks . Consider portion sizes and frequency of consumption of certain foods   Meal Ideas & Options:  . Breakfast:  o Whole wheat toast or whole wheat English muffins with peanut butter & hard boiled egg o Steel cut oats topped with apples & cinnamon and skim milk  o Fresh fruit: banana, strawberries, melon, berries, peaches  o Smoothies: strawberries, bananas, greek yogurt, peanut butter o Low fat greek yogurt with blueberries and granola  o Egg white omelet with spinach and mushrooms o Breakfast couscous: whole wheat couscous, apricots, skim milk, cranberries  . Sandwiches:  o Hummus and grilled vegetables (peppers, zucchini, squash) on whole wheat bread   o Grilled chicken on whole wheat pita with lettuce, tomatoes, cucumbers or tzatziki  o Tuna salad on whole wheat bread: tuna salad made with greek yogurt, olives, red peppers, capers, green onions o Garlic rosemary lamb pita: lamb sauted with garlic, rosemary, salt & pepper; add lettuce, cucumber, greek yogurt to pita - flavor with lemon juice and black pepper  . Seafood:  o Mediterranean grilled salmon, seasoned with garlic, basil,  parsley, lemon juice and black pepper o Shrimp, lemon, and spinach whole-grain pasta salad made with low fat greek yogurt  o Seared scallops with lemon orzo  o Seared tuna steaks seasoned salt, pepper, coriander topped with tomato mixture of olives, tomatoes,  olive oil, minced garlic, parsley, green onions and cappers  . Meats:  o Herbed greek chicken salad with kalamata olives, cucumber, feta  o Red bell peppers stuffed with spinach, bulgur, lean ground beef (or lentils) & topped with feta   o Kebabs: skewers of chicken, tomatoes, onions, zucchini, squash  o [place] burgers: made with red onions, mint, dill, lemon juice, feta cheese topped with roasted red peppers . Vegetarian o Cucumber salad: cucumbers, artichoke hearts, celery, red onion, feta cheese, tossed in olive oil & lemon juice  o Hummus and whole grain pita points with a greek salad (lettuce, tomato, feta, olives, cucumbers, red onion) o Lentil soup with celery, carrots made with vegetable broth, garlic, salt and pepper  o Tabouli salad: parsley, bulgur, mint, scallions, cucumbers, tomato, radishes, lemon juice, olive oil, salt and pepper.      American Heart Association (AHA) Exercise Recommendation  Being physically active is important to prevent heart disease and stroke, the nation's No. 1and No. 5killers. To improve overall cardiovascular health, we suggest at least 150 minutes per week of moderate exercise or 75 minutes per week of vigorous exercise (or a combination of moderate and vigorous activity). Thirty minutes a day, five times a week is an easy goal to remember. You will also experience benefits even if you divide your time into two or three segments of 10 to 15 minutes per day.  For people who would benefit from lowering their blood pressure or cholesterol, we recommend 40 minutes of aerobic exercise of moderate to vigorous intensity three to four times a week to lower the risk for heart attack and stroke.  Physical activity is anything that makes you move your body and burn calories.  This includes things like climbing stairs or playing sports. Aerobic exercises benefit your heart, and include walking, jogging, swimming or biking. Strength and stretching exercises are best  for overall stamina and flexibility.  The simplest, positive change you can make to effectively improve your heart health is to start walking. It's enjoyable, free, easy, social and great exercise. A walking program is flexible and boasts high success rates because people can stick with it. It's easy for walking to become a regular and satisfying part of life.   For Overall Cardiovascular Health:  At least 30 minutes of moderate-intensity aerobic activity at least 5 days per week for a total of 150  OR   At least 25 minutes of vigorous aerobic activity at least 3 days per week for a total of 75 minutes; or a combination of moderate- and vigorous-intensity aerobic activity  AND   Moderate- to high-intensity muscle-strengthening activity at least 2 days per week for additional health benefits.  For Lowering Blood Pressure and Cholesterol  An average 40 minutes of moderate- to vigorous-intensity aerobic activity 3 or 4 times per week  What if I can't make it to the time goal? Something is always better than nothing! And everyone has to start somewhere. Even if you've been sedentary for years, today is the day you can begin to make healthy changes in your life. If you don't think you'll make it for 30 or 40 minutes, set a reachable goal for today. You can work up toward your overall goal by  increasing your time as you get stronger. Don't let all-or-nothing thinking rob you of doing what you can every day.  Source:http://www.heart.org

## 2018-08-23 NOTE — Progress Notes (Signed)
Patient ID: Patricia Mercado, female   DOB: [birth year], 53 y.o.   MRN: [id number]      Gynecology Annual Exam  PCP: Erasmo Downer, NP  Chief Complaint:  Chief Complaint  Patient presents with  . Annual Exam    History of Present Illness:Patient is a 53 y.o. [id number] presents for annual exam. The patient has complaint today of heavy period in January. In the past year her periods have been every 1-3 months of moderate flow and lasting 5 days. In January it had been a couple of months since she had a period and it was much heavier than normal. She wore super tampons that she changed 3 times a day for the first 4 days. Discussed the nature of perimenopausal time with regards to irregular cycles and changes in bleeding pattern prior to becoming menopausal.    LMP: 08/04/2018. Patient is perimenopausal. Intermenstrual Bleeding: no Postcoital Bleeding: no Dysmenorrhea: no  The patient is sexually active. She denies dyspareunia.  The patient does perform self breast exams.  There is no notable family history of breast or ovarian cancer in her family.  The patient wears seatbelts: yes.   The patient has regular exercise: She does not have regular exercise in the winter but states that she does increase her physical activity in the spring and summer with primarily walking. She is still drinking diet dr peppers but has tried to increase h2o intake. She admits 7-8 hours sleep per night.    The patient denies current symptoms of depression.     Review of Systems: Review of Systems  Constitutional: Negative.   HENT: Negative.   Eyes: Negative.   Respiratory:       Difficulty breathing when laying down  Cardiovascular:       Palpitations   Gastrointestinal: Negative.   Genitourinary: Negative.   Musculoskeletal: Negative.   Skin: Negative.   Neurological: Negative.   Endo/Heme/Allergies: Negative.   Psychiatric/Behavioral: Negative.     Past Medical History:  Past Medical History:    Diagnosis Date  . Breast cyst 2012, 2014   right breast  . Carpal tunnel syndrome   . Mastitis     Past Surgical History:  Past Surgical History:  Procedure Laterality Date  . BREAST CYST ASPIRATION Left 2012    Gynecologic History:  No LMP recorded. Patient is perimenopausal. Last Pap: 3 years ago Results were:  no abnormalities  Last mammogram: 1 year ago Results were: BI-RAD II benign  Obstetric History: [id number]  Family History:  Family History  Problem Relation Age of Onset  . Cancer Maternal Grandfather        colon  . Prostate cancer Father   . Breast cancer Neg Hx     Social History:  Social History   Socioeconomic History  . Marital status: Married    Spouse name: Not on file  . Number of children: Not on file  . Years of education: Not on file  . Highest education level: Not on file  Occupational History  . Not on file  Social Needs  . Financial resource strain: Not on file  . Food insecurity:    Worry: Not on file    Inability: Not on file  . Transportation needs:    Medical: Not on file    Non-medical: Not on file  Tobacco Use  . Smoking status: Never Smoker  . Smokeless tobacco: Never Used  Substance and Sexual Activity  . Alcohol use: No  . Drug use:  No  . Sexual activity: Yes    Birth control/protection: Condom  Lifestyle  . Physical activity:    Days per week: Not on file    Minutes per session: Not on file  . Stress: Not on file  Relationships  . Social connections:    Talks on phone: Not on file    Gets together: Not on file    Attends religious service: Not on file    Active member of club or organization: Not on file    Attends meetings of clubs or organizations: Not on file    Relationship status: Not on file  . Intimate partner violence:    Fear of current or ex partner: Not on file    Emotionally abused: Not on file    Physically abused: Not on file    Forced sexual activity: Not on file  Other Topics Concern  . Not on  file  Social History Narrative  . Not on file    Allergies:  No Known Allergies  Medications: Prior to Admission medications   Not on File    Physical Exam Vitals: Blood pressure 114/70, height 5\' 2"  (1.575 m), weight 110 lb (49.9 kg).  General: NAD HEENT: normocephalic, anicteric Thyroid: no enlargement, no palpable nodules Pulmonary: No increased work of breathing, CTAB Cardiovascular: RRR, distal pulses 2+ Breast: Breast symmetrical, no tenderness, no palpable nodules or masses, no skin or nipple retraction present, no nipple discharge.  No axillary or supraclavicular lymphadenopathy. Abdomen: NABS, soft, non-tender, non-distended.  Umbilicus without lesions.  No hepatomegaly, splenomegaly or masses palpable. No evidence of hernia  Genitourinary:  External: Normal external female genitalia.  Normal urethral meatus, normal Bartholin's and Skene's glands.    Vagina: Normal vaginal mucosa, no evidence of prolapse.    Cervix: Grossly normal in appearance, no bleeding, no CMT  Uterus: deferred for no concerns    Adnexa: deferred for no concerns  Rectal: deferred  Lymphatic: no evidence of inguinal lymphadenopathy Extremities: no edema, erythema, or tenderness Neurologic: Grossly intact Psychiatric: mood appropriate, affect full     Assessment: 53 y.o. [id number] routine annual exam  Plan: Problem List Items Addressed This Visit    None    Visit Diagnoses    Well woman exam with routine gynecological exam    -  Primary   Relevant Orders   Cytology - PAP   Screen for colon cancer       Relevant Orders   Ambulatory referral to Gastroenterology   Screen for sexually transmitted diseases       Relevant Orders   Cytology - PAP      1) Mammogram - recommend yearly screening mammogram.  Mammogram patient will schedule in April  2) STI screening  was offered and declined  3) ASCCP guidelines and rationale discussed.  Patient opts for every 3 years screening  interval  4) Osteoporosis  - per USPTF routine screening DEXA at age 22   Consider FDA-approved medical therapies in postmenopausal women and men aged 51 years and older, based on the following: a) A hip or vertebral (clinical or morphometric) fracture b) T-score ? -2.5 at the femoral neck or spine after appropriate evaluation to exclude secondary causes C) Low bone mass (T-score between -1.0 and -2.5 at the femoral neck or spine) and a 10-year probability of a hip fracture ? 3% or a 10-year probability of a major osteoporosis-related fracture ? 20% based on the [place]-adapted WHO algorithm   5) Routine healthcare maintenance including cholesterol, diabetes  screening discussed Declines  6) Colonoscopy referral to [facility] sent today for baseline screening.  Screening recommended starting at age 77 for average risk individuals, age 77 for individuals deemed at increased risk (including African Americans) and recommended to continue until age 65.  For patient age 33-85 individualized approach is recommended.  Gold standard screening is via colonoscopy, Cologuard screening is an acceptable alternative for patient unwilling or unable to undergo colonoscopy.  "Colorectal cancer screening for average?risk adults: 2018 guideline update from the American Cancer Society"[place]: A Cancer Journal for Clinicians: Nov 16, 2016   7) Return in 1 year (on 08/23/2019).   Tresea Mall, CNM [facility], [facility] 08/23/2018, 4:06 PM

## 2018-08-28 LAB — CYTOLOGY - PAP
DIAGNOSIS: NEGATIVE
HPV (WINDOPATH): NOT DETECTED

## 2018-08-30 ENCOUNTER — Telehealth: Payer: Self-pay

## 2018-08-30 ENCOUNTER — Other Ambulatory Visit: Payer: Self-pay

## 2018-08-30 NOTE — Telephone Encounter (Signed)
Patient returned call to schedule colonoscopy.  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  I've sent instructions to her just so she would have on hand when she is ready to schedule.  Gastroenterology Pre-Procedure Review  Request Date: (TBD) Requesting Physician: Dr. (TBD)  PATIENT REVIEW QUESTIONS: The patient responded to the following health history questions as indicated:    1. Are you having any GI issues? no 2. Do you have a personal history of Polyps? no 3. Do you have a family history of Colon Cancer or Polyps? Maternal Grandpa Colon Cancer 4. Diabetes Mellitus? no 5. Joint replacements in the past 12 months?no 6. Major health problems in the past 3 months?no 7. Any artificial heart valves, MVP, or defibrillator?no    MEDICATIONS & ALLERGIES:    Patient reports the following regarding taking any anticoagulation/antiplatelet therapy:   Plavix, Coumadin, Eliquis, Xarelto, Lovenox, Pradaxa, Brilinta, or Effient? no Aspirin? no  Patient confirms/reports the following medications:  No current outpatient medications on file.   No current facility-administered medications for this visit.     Patient confirms/reports the following allergies:  No Known Allergies  No orders of the defined types were placed in this encounter.   AUTHORIZATION INFORMATION Primary Insurance: 1D#: Group #:  Secondary Insurance: 1D#: Group #:  SCHEDULE INFORMATION: Date: PENDING CALL BACK TO SCHEDULE Time: Location:[facility]

## 2018-09-17 ENCOUNTER — Encounter: Payer: Self-pay | Admitting: *Deleted

## 2018-10-02 ENCOUNTER — Other Ambulatory Visit: Payer: Self-pay | Admitting: Advanced Practice Midwife

## 2018-10-02 DIAGNOSIS — Z1231 Encounter for screening mammogram for malignant neoplasm of breast: Secondary | ICD-10-CM

## 2018-11-26 ENCOUNTER — Other Ambulatory Visit: Payer: Self-pay

## 2018-11-26 ENCOUNTER — Ambulatory Visit
Admission: RE | Admit: 2018-11-26 | Discharge: 2018-11-26 | Disposition: A | Discharge status: Home / Self Care | Payer: BC Managed Care – PPO | Source: Ambulatory Visit | Attending: Advanced Practice Midwife | Admitting: Advanced Practice Midwife

## 2018-11-26 DIAGNOSIS — Z1231 Encounter for screening mammogram for malignant neoplasm of breast: Secondary | ICD-10-CM | POA: Diagnosis present

## 2019-03-16 IMAGING — MG MM DIGITAL DIAGNOSTIC BILAT W/ TOMO W/ CAD
6 of 10 series · 6 of 30 positions shown · non-contrast
Comparison: Previous exam(s).

CLINICAL DATA: Screening recall for a possible asymmetry in the
right breast and a possible mass in the left breast.

EXAM:
DIGITAL DIAGNOSTIC BILATERAL MAMMOGRAM WITH CAD AND TOMO
ULTRASOUND BILATERAL BREAST

[R CC synth-2D]
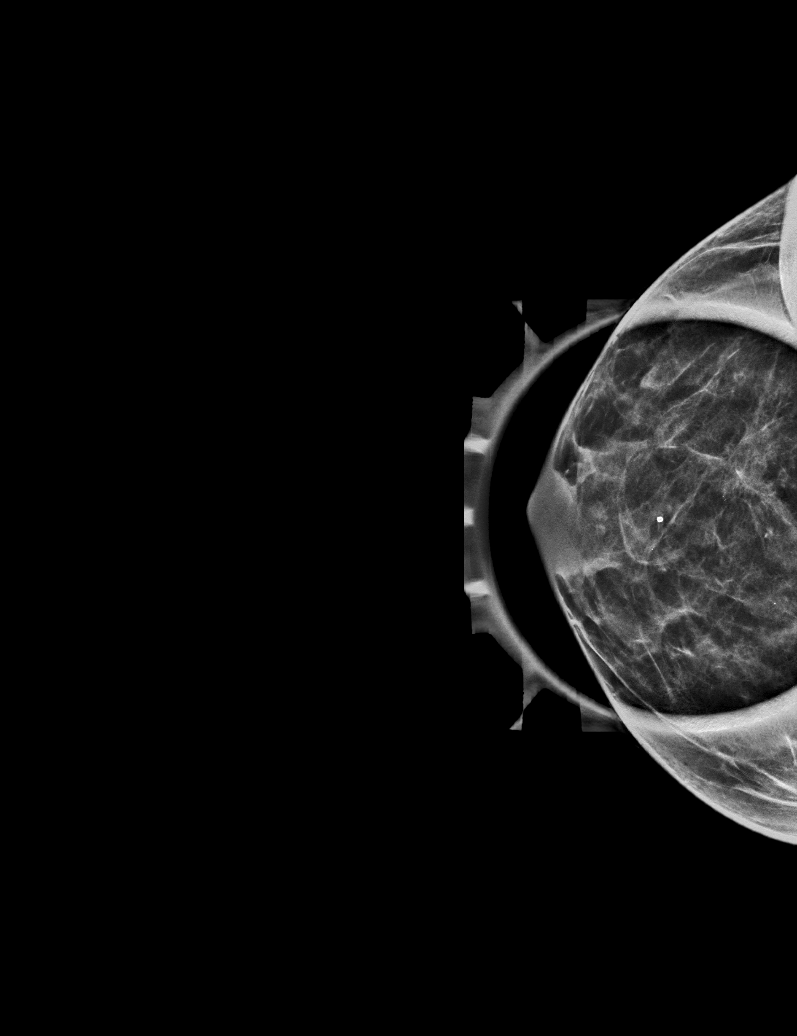

[R ML synth-2D]
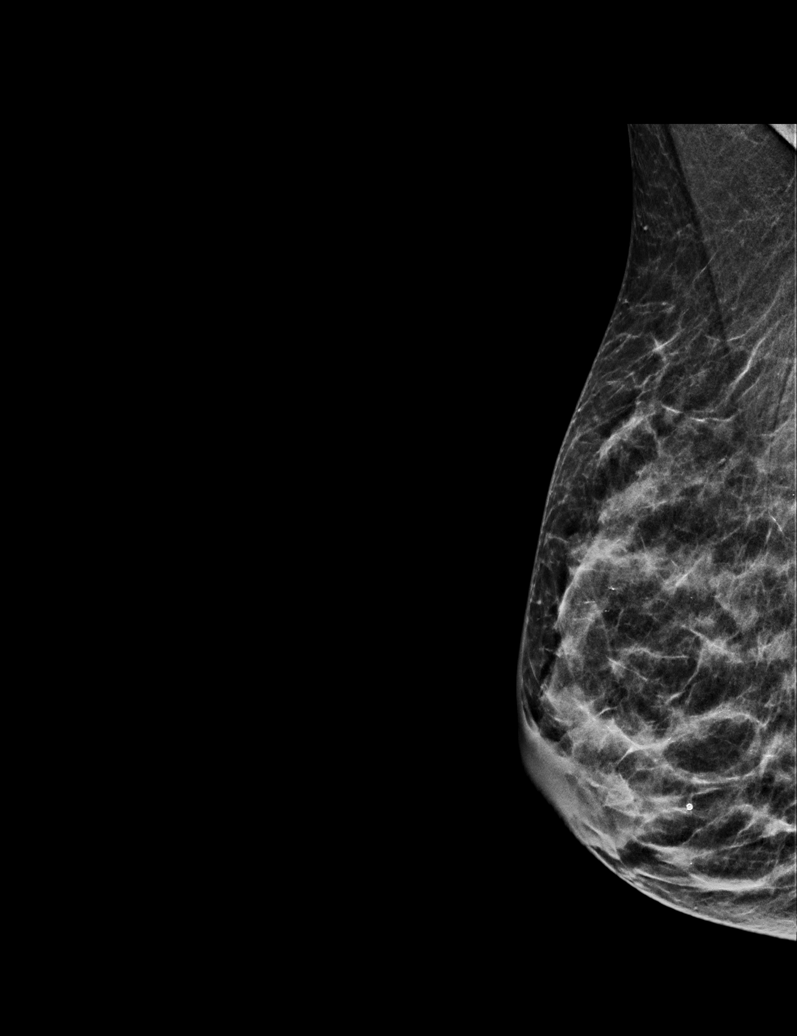

[L MLO synth-2D]
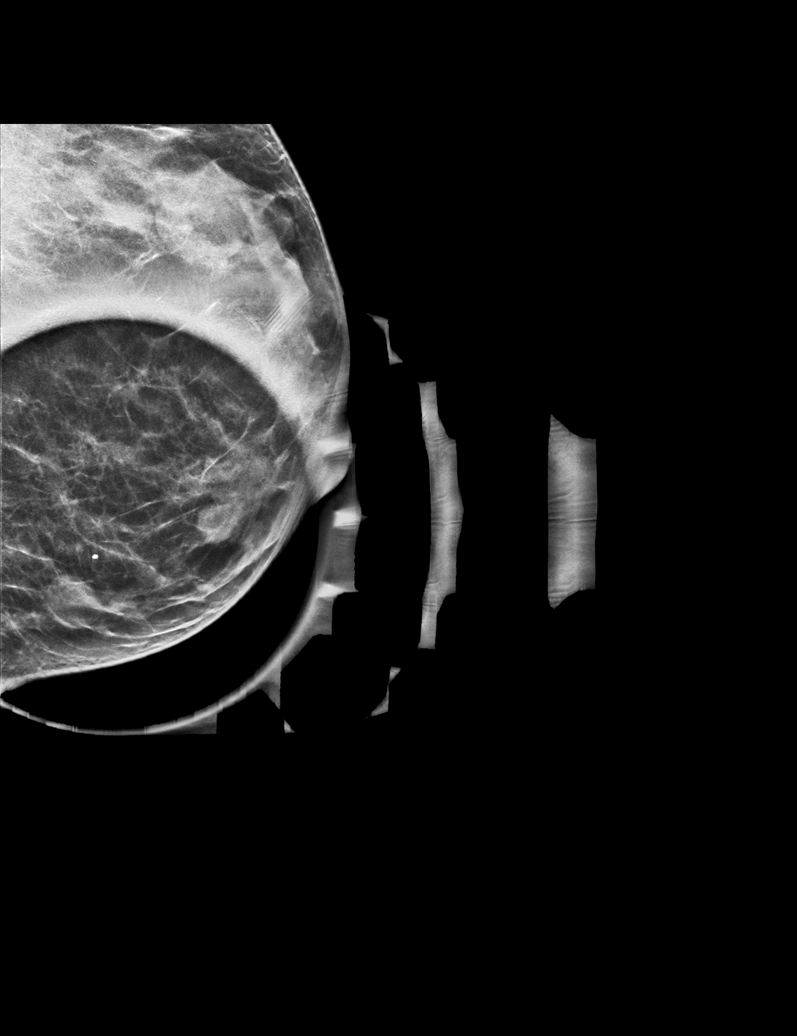

[L CC synth-2D]
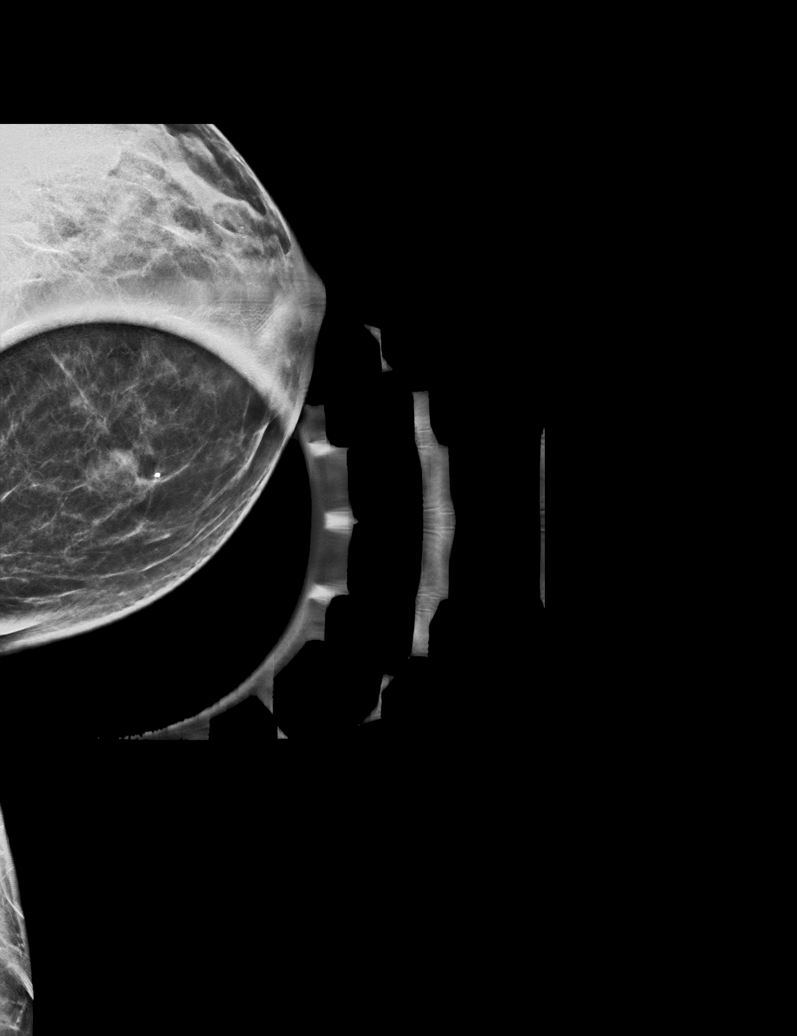

[R MLO synth-2D]
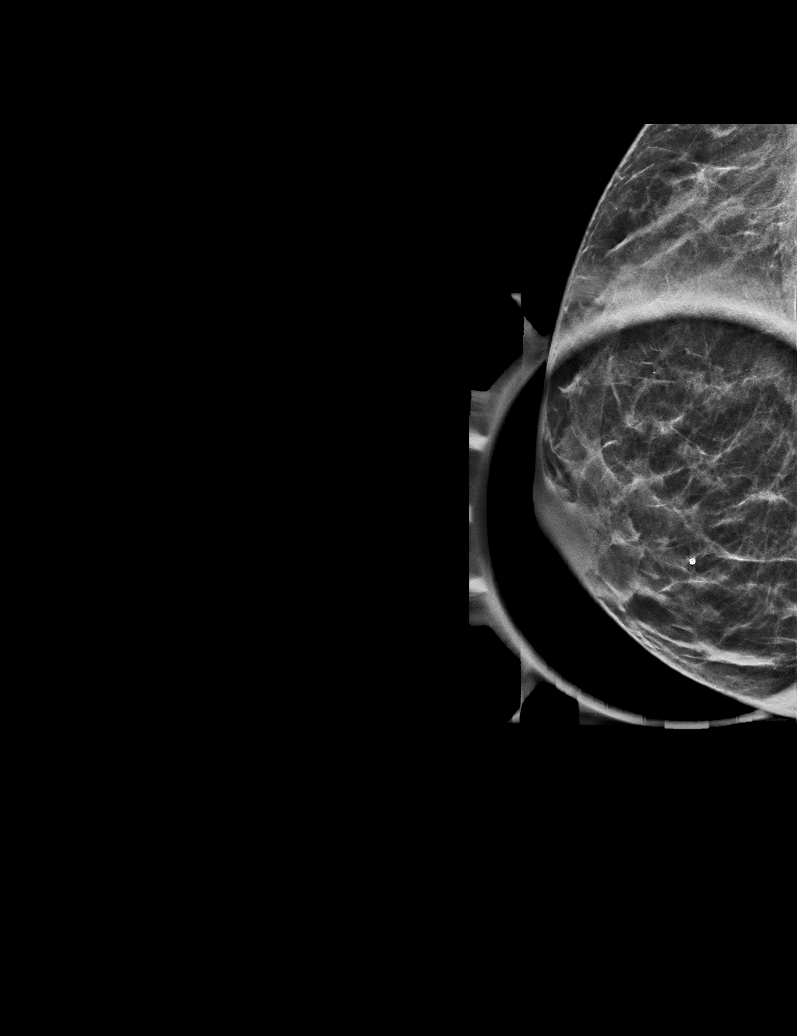

[L CC tomo · tomo slice 23/44.0]
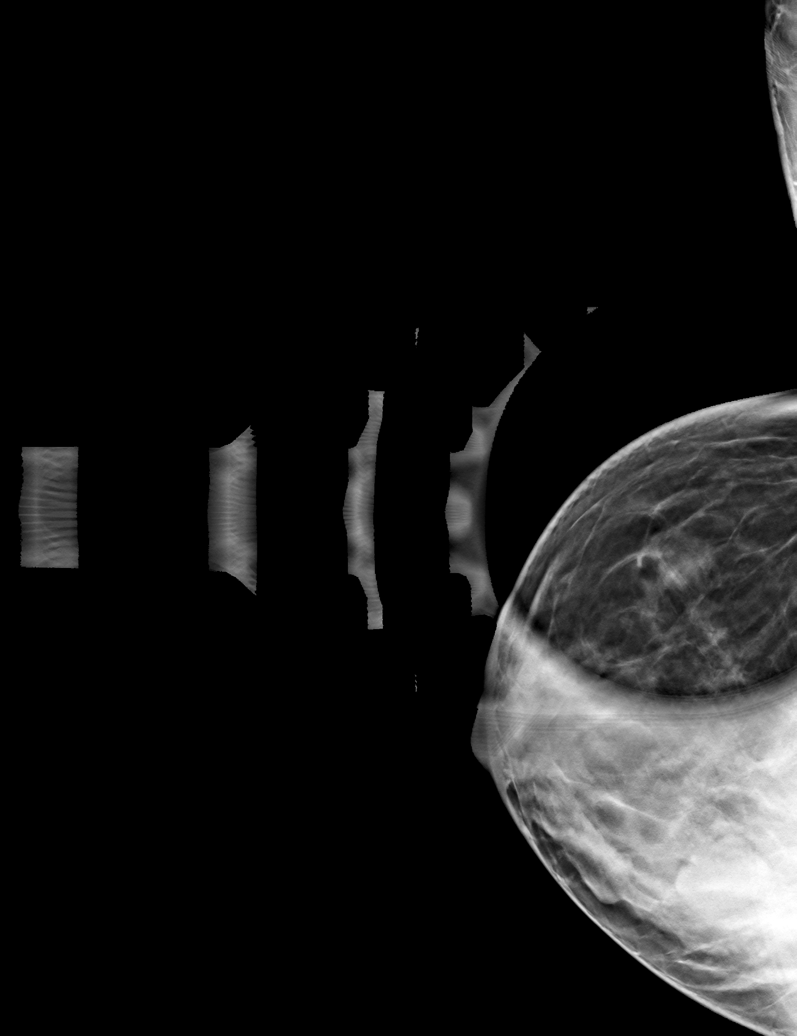

[6 of 30 positions shown; findings below may reference images not displayed]

ACR Breast Density Category c: The breast tissue is heterogeneously
dense, which may obscure small masses.
FINDINGS: In the right breast, no discrete masses noted on the diagnostic
spot-compression views. The area of asymmetry disperses. There is no
architectural distortion and there are no suspicious calcifications.

On the left, the possible mass in the lower inner quadrant the
breast persists on the diagnostic images. It is oval with
circumscribed margins. Another partly circumscribed masses seen in
the lower inner quadrant anterior to this. There are no areas of
architectural distortion. There are no suspicious calcifications.

Mammographic images were processed with CAD.

On physical exam, no mass is palpated in either breast.

Targeted right breast ultrasound is performed, showing multiple
dilated ducts with several small cysts. There are no solid masses or
suspicious lesions.

Targeted ultrasound is performed, showing a cyst in the left breast
at 7 o'clock, 2 cm the nipple, middle depth, measuring 8 x 7 x 8 mm.
Another cyst is seen posterior to this, at 7 o'clock, 5 cm the
nipple, posterior depth, measuring 12 x 6 x 11 mm. This latter cyst
represents the screening mammographic mass. There are no solid
masses or suspicious lesions.
IMPRESSION: 1. No evidence of breast malignancy.
2. Benign dilated retroareolar ducts and small cysts in the right
breast. Benign lower inner quadrant left breast cysts.

RECOMMENDATION:
Screening mammogram in one year.(Code:[id number])

I have discussed the findings and recommendations with the patient.
Results were also provided in writing at the conclusion of the
visit. If applicable, a reminder letter will be sent to the patient
regarding the next appointment.

BI-RADS CATEGORY  2: Benign.

## 2019-04-02 ENCOUNTER — Telehealth: Payer: Self-pay

## 2019-04-02 NOTE — Telephone Encounter (Signed)
Patient inquiring about lab and diagnoses codes for wellness blood work. JEG asked her at her AE in March if she wanted it done and she declined as she didn't know if her insurance would cover or not. She is 53 and hasn't had blood work in a long time and feels that she needs it. Please advise of lab test codes and diagnosis codes so she can contact insurance to verify coverage. [phone]

## 2019-04-03 ENCOUNTER — Other Ambulatory Visit: Payer: Self-pay | Admitting: Advanced Practice Midwife

## 2019-04-03 NOTE — Telephone Encounter (Signed)
The CPT code for annual gyn is [phone] ICD 10 diagnosis code for exam with abnormal findings is: Z01.411 ICD 10 diagnosis code for exam without abnormal findings is: Z01.419  ICD 10 diagnosis code for Routine blood tests for general physical exam is: Z00.00  I don't know anything about specific lab codes- I think she needs this information from someone who knows more about these things.   Also it seems like she would have to wait until her next annual exam to get this lab work since the routine labs are part of a wellness exam- not as stand alone?  Can you please let her know these things. Thank you

## 2019-04-04 NOTE — Telephone Encounter (Signed)
LMVM TRC to discuss information requested.

## 2019-04-04 NOTE — Telephone Encounter (Signed)
Pt aware. She will wait til her AE to get these done to keep on schedule.

## 2019-11-15 ENCOUNTER — Other Ambulatory Visit: Payer: Self-pay | Admitting: Nurse Practitioner

## 2019-12-06 ENCOUNTER — Encounter: Payer: Self-pay | Admitting: Advanced Practice Midwife

## 2019-12-06 ENCOUNTER — Ambulatory Visit (INDEPENDENT_AMBULATORY_CARE_PROVIDER_SITE_OTHER): Payer: BC Managed Care – PPO | Admitting: Advanced Practice Midwife

## 2019-12-06 ENCOUNTER — Other Ambulatory Visit: Payer: Self-pay

## 2019-12-06 VITALS — BP 120/80 | Ht 62.0 in | Wt 108.0 lb

## 2019-12-06 DIAGNOSIS — Z1211 Encounter for screening for malignant neoplasm of colon: Secondary | ICD-10-CM | POA: Diagnosis not present

## 2019-12-06 DIAGNOSIS — Z1239 Encounter for other screening for malignant neoplasm of breast: Secondary | ICD-10-CM

## 2019-12-06 DIAGNOSIS — Z0000 Encounter for general adult medical examination without abnormal findings: Secondary | ICD-10-CM

## 2019-12-06 NOTE — Patient Instructions (Addendum)
Health Maintenance, Female Adopting a healthy lifestyle and getting preventive care are important in promoting health and wellness. Ask your health care provider about:  The right schedule for you to have regular tests and exams.  Things you can do on your own to prevent diseases and keep yourself healthy. What should I know about diet, weight, and exercise? Eat a healthy diet   Eat a diet that includes plenty of vegetables, fruits, low-fat dairy products, and lean protein.  Do not eat a lot of foods that are high in solid fats, added sugars, or sodium. Maintain a healthy weight Body mass index (BMI) is used to identify weight problems. It estimates body fat based on height and weight. Your health care provider can help determine your BMI and help you achieve or maintain a healthy weight. Get regular exercise Get regular exercise. This is one of the most important things you can do for your health. Most adults should:  Exercise for at least 150 minutes each week. The exercise should increase your heart rate and make you sweat (moderate-intensity exercise).  Do strengthening exercises at least twice a week. This is in addition to the moderate-intensity exercise.  Spend less time sitting. Even light physical activity can be beneficial. Watch cholesterol and blood lipids Have your blood tested for lipids and cholesterol at 52 years of age, then have this test every 5 years. Have your cholesterol levels checked more often if:  Your lipid or cholesterol levels are high.  You are older than 52 years of age.  You are at high risk for heart disease. What should I know about cancer screening? Depending on your health history and family history, you may need to have cancer screening at various ages. This may include screening for:  Breast cancer.  Cervical cancer.  Colorectal cancer.  Skin cancer.  Lung cancer. What should I know about heart disease, diabetes, and high blood  pressure? Blood pressure and heart disease  High blood pressure causes heart disease and increases the risk of stroke. This is more likely to develop in people who have high blood pressure readings, are of African descent, or are overweight.  Have your blood pressure checked: ? Every 3-5 years if you are 18-39 years of age. ? Every year if you are 40 years old or older. Diabetes Have regular diabetes screenings. This checks your fasting blood sugar level. Have the screening done:  Once every three years after age 40 if you are at a normal weight and have a low risk for diabetes.  More often and at a younger age if you are overweight or have a high risk for diabetes. What should I know about preventing infection? Hepatitis B If you have a higher risk for hepatitis B, you should be screened for this virus. Talk with your health care provider to find out if you are at risk for hepatitis B infection. Hepatitis C Testing is recommended for:  Everyone born from 1945 through 1965.  Anyone with known risk factors for hepatitis C. Sexually transmitted infections (STIs)  Get screened for STIs, including gonorrhea and chlamydia, if: ? You are sexually active and are younger than 52 years of age. ? You are older than 52 years of age and your health care provider tells you that you are at risk for this type of infection. ? Your sexual activity has changed since you were last screened, and you are at increased risk for chlamydia or gonorrhea. Ask your health care provider if   you are at risk.  Ask your health care provider about whether you are at high risk for HIV. Your health care provider may recommend a prescription medicine to help prevent HIV infection. If you choose to take medicine to prevent HIV, you should first get tested for HIV. You should then be tested every 3 months for as long as you are taking the medicine. Pregnancy  If you are about to stop having your period (premenopausal) and  you may become pregnant, seek counseling before you get pregnant.  Take 400 to 800 micrograms (mcg) of folic acid every day if you become pregnant.  Ask for birth control (contraception) if you want to prevent pregnancy. Osteoporosis and menopause Osteoporosis is a disease in which the bones lose minerals and strength with aging. This can result in bone fractures. If you are 65 years old or older, or if you are at risk for osteoporosis and fractures, ask your health care provider if you should:  Be screened for bone loss.  Take a calcium or vitamin D supplement to lower your risk of fractures.  Be given hormone replacement therapy (HRT) to treat symptoms of menopause. Follow these instructions at home: Lifestyle  Do not use any products that contain nicotine or tobacco, such as cigarettes, e-cigarettes, and chewing tobacco. If you need help quitting, ask your health care provider.  Do not use street drugs.  Do not share needles.  Ask your health care provider for help if you need support or information about quitting drugs. Alcohol use  Do not drink alcohol if: ? Your health care provider tells you not to drink. ? You are pregnant, may be pregnant, or are planning to become pregnant.  If you drink alcohol: ? Limit how much you use to 0-1 drink a day. ? Limit intake if you are breastfeeding.  Be aware of how much alcohol is in your drink. In the [place] one drink equals one 12 oz bottle of beer (355 mL), one 5 oz glass of wine (148 mL), or one 1 oz glass of hard liquor (44 mL). General instructions  Schedule regular health, dental, and eye exams.  Stay current with your vaccines.  Tell your health care provider if: ? You often feel depressed. ? You have ever been abused or do not feel safe at home. Summary  Adopting a healthy lifestyle and getting preventive care are important in promoting health and wellness.  Follow your health care provider's instructions about healthy  diet, exercising, and getting tested or screened for diseases.  Follow your health care provider's instructions on monitoring your cholesterol and blood pressure. This information is not intended to replace advice given to you by your health care provider. Make sure you discuss any questions you have with your health care provider. Document Revised: 05/30/2018 Document Reviewed: 05/30/2018 Elsevier Patient Education  2020 Elsevier Inc.  Menopause Menopause is the normal time of life when menstrual periods stop completely. It is usually confirmed by 12 months without a menstrual period. The transition to menopause (perimenopause) most often happens between the ages of 45 and 55. During perimenopause, hormone levels change in your body, which can cause symptoms and affect your health. Menopause may increase your risk for:  Loss of bone (osteoporosis), which causes bone breaks (fractures).  Depression.  Hardening and narrowing of the arteries (atherosclerosis), which can cause heart attacks and strokes. What are the causes? This condition is usually caused by a natural change in hormone levels that happens as you   get older. The condition may also be caused by surgery to remove both ovaries (bilateral oophorectomy). What increases the risk? This condition is more likely to start at an earlier age if you have certain medical conditions or treatments, including:  A tumor of the pituitary gland in the brain.  A disease that affects the ovaries and hormone production.  Radiation treatment for cancer.  Certain cancer treatments, such as chemotherapy or hormone (anti-estrogen) therapy.  Heavy smoking and excessive alcohol use.  Family history of early menopause. This condition is also more likely to develop earlier in women who are very thin. What are the signs or symptoms? Symptoms of this condition include:  Hot flashes.  Irregular menstrual periods.  Night sweats.  Changes in  feelings about sex. This could be a decrease in sex drive or an increased comfort around your sexuality.  Vaginal dryness and thinning of the vaginal walls. This may cause painful intercourse.  Dryness of the skin and development of wrinkles.  Headaches.  Problems sleeping (insomnia).  Mood swings or irritability.  Memory problems.  Weight gain.  Hair growth on the face and chest.  Bladder infections or problems with urinating. How is this diagnosed? This condition is diagnosed based on your medical history, a physical exam, your age, your menstrual history, and your symptoms. Hormone tests may also be done. How is this treated? In some cases, no treatment is needed. You and your health care provider should make a decision together about whether treatment is necessary. Treatment will be based on your individual condition and preferences. Treatment for this condition focuses on managing symptoms. Treatment may include:  Menopausal hormone therapy (MHT).  Medicines to treat specific symptoms or complications.  Acupuncture.  Vitamin or herbal supplements. Before starting treatment, make sure to let your health care provider know if you have a personal or family history of:  Heart disease.  Breast cancer.  Blood clots.  Diabetes.  Osteoporosis. Follow these instructions at home: Lifestyle  Do not use any products that contain nicotine or tobacco, such as cigarettes and e-cigarettes. If you need help quitting, ask your health care provider.  Get at least 30 minutes of physical activity on 5 or more days each week.  Avoid alcoholic and caffeinated beverages, as well as spicy foods. This may help prevent hot flashes.  Get 7-8 hours of sleep each night.  If you have hot flashes, try: ? Dressing in layers. ? Avoiding things that may trigger hot flashes, such as spicy food, warm places, or stress. ? Taking slow, deep breaths when a hot flash starts. ? Keeping a fan in  your home and office.  Find ways to manage stress, such as deep breathing, meditation, or journaling.  Consider going to group therapy with other women who are having menopause symptoms. Ask your health care provider about recommended group therapy meetings. Eating and drinking  Eat a healthy, balanced diet that contains whole grains, lean protein, low-fat dairy, and plenty of fruits and vegetables.  Your health care provider may recommend adding more soy to your diet. Foods that contain soy include tofu, tempeh, and soy milk.  Eat plenty of foods that contain calcium and vitamin D for bone health. Items that are rich in calcium include low-fat milk, yogurt, beans, almonds, sardines, broccoli, and kale. Medicines  Take over-the-counter and prescription medicines only as told by your health care provider.  Talk with your health care provider before starting any herbal supplements. If prescribed, take vitamins and supplements   as told by your health care provider. These may include: ? Calcium. Women age 51 and older should get 1,200 mg (milligrams) of calcium every day. ? Vitamin D. Women need 600-800 International Units of vitamin D each day. ? Vitamins B12 and B6. Aim for 50 micrograms of B12 and 1.5 mg of B6 each day. General instructions  Keep track of your menstrual periods, including: ? When they occur. ? How heavy they are and how long they last. ? How much time passes between periods.  Keep track of your symptoms, noting when they start, how often you have them, and how long they last.  Use vaginal lubricants or moisturizers to help with vaginal dryness and improve comfort during sex.  Keep all follow-up visits as told by your health care provider. This is important. This includes any group therapy or counseling. Contact a health care provider if:  You are still having menstrual periods after age 55.  You have pain during sex.  You have not had a period for 12 months and  you develop vaginal bleeding. Get help right away if:  You have: ? Severe depression. ? Excessive vaginal bleeding. ? Pain when you urinate. ? A fast or irregular heart beat (palpitations). ? Severe headaches. ? Abdomen (abdominal) pain or severe indigestion.  You fell and you think you have a broken bone.  You develop leg or chest pain.  You develop vision problems.  You feel a lump in your breast. Summary  Menopause is the normal time of life when menstrual periods stop completely. It is usually confirmed by 12 months without a menstrual period.  The transition to menopause (perimenopause) most often happens between the ages of 45 and 55.  Symptoms can be managed through medicines, lifestyle changes, and complementary therapies such as acupuncture.  Eat a balanced diet that is rich in nutrients to promote bone health and heart health and to manage symptoms during menopause. This information is not intended to replace advice given to you by your health care provider. Make sure you discuss any questions you have with your health care provider. Document Revised: 05/19/2017 Document Reviewed: 07/09/2016 Elsevier Patient Education  2020 Elsevier Inc.  

## 2019-12-06 NOTE — Progress Notes (Signed)
Gynecology Annual Exam  PCP: Renee Rival, NP  Chief Complaint:  Chief Complaint  Patient presents with  . Gynecologic Exam    History of Present Illness:Patient is a 54 y.o. [id number] presents for annual exam. The patient has no complaints today. Colonoscopy referral was sent last year, however, she never scheduled the appointment. She accepts referral again today.  LMP: No LMP recorded. Patient is perimenopausal. She did not have a period from June to December of last year and has had monthly periods since then that last 5-6 days and are of moderate flow. Intermenstrual Bleeding: no Postcoital Bleeding: no Dysmenorrhea: no  The patient is sexually active. She denies dyspareunia.  The patient does perform self breast exams.  There is no notable family history of breast or ovarian cancer in her family.  The patient wears seatbelts: yes.   The patient has regular exercise: she walks regularly. She admits healthy diet and admits drinking more diet soda than water. She gets adequate sleep. 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  Her last unmarried child (son) is getting married this summer.   The patient denies current symptoms of depression.     Review of Systems: Review of Systems  Constitutional: Negative for chills and fever.  HENT: Negative for congestion, ear discharge, ear pain, hearing loss, sinus pain and sore throat.   Eyes: Negative for blurred vision and double vision.  Respiratory: Negative for cough, shortness of breath and wheezing.   Cardiovascular: Negative for chest pain, palpitations and leg swelling.  Gastrointestinal: Negative for abdominal pain, blood in stool, constipation, diarrhea, heartburn, melena, nausea and vomiting.  Genitourinary: Negative for dysuria, flank pain, frequency, hematuria and urgency.  Musculoskeletal: Negative for back pain, joint pain and myalgias.  Skin: Negative for itching and rash.  Neurological: Negative for  dizziness, tingling, tremors, sensory change, speech change, focal weakness, seizures, loss of consciousness, weakness and headaches.  Endo/Heme/Allergies: Negative for environmental allergies. Does not bruise/bleed easily.  Psychiatric/Behavioral: Negative for depression, hallucinations, memory loss, substance abuse and suicidal ideas. The patient is not nervous/anxious and does not have insomnia.     Past Medical History:  Patient Active Problem List   Diagnosis Date Noted  . Breast cyst 12/31/2012    Past Surgical History:  Past Surgical History:  Procedure Laterality Date  . BREAST CYST ASPIRATION Left 2012    Gynecologic History:  No LMP recorded. Patient is perimenopausal. Last Pap: 1 year ago Results were:  no abnormalities  Last mammogram: 1 year ago Results were: BI-RAD I  Obstetric History: [id number]  Family History:  Family History  Problem Relation Age of Onset  . Cancer Maternal Grandfather        colon  . Prostate cancer Father   . Breast cancer Neg Hx     Social History:  Social History   Socioeconomic History  . Marital status: Married    Spouse name: Not on file  . Number of children: Not on file  . Years of education: Not on file  . Highest education level: Not on file  Occupational History  . Not on file  Tobacco Use  . Smoking status: Never Smoker  . Smokeless tobacco: Never Used  Vaping Use  . Vaping Use: Never used  Substance and Sexual Activity  . Alcohol use: No  . Drug use: No  . Sexual activity: Yes    Birth control/protection: Condom  Other Topics Concern  . Not on file  Social History Narrative  . Not on file   Social Determinants of Health   Financial Resource Strain:   . Difficulty of Paying Living Expenses:   Food Insecurity:   . Worried About Programme researcher, broadcasting/film/video in the Last Year:   . Barista in the Last Year:   Transportation Needs:   . Freight forwarder (Medical):   Marland Kitchen Lack of Transportation (Non-Medical):    Physical Activity:   . Days of Exercise per Week:   . Minutes of Exercise per Session:   Stress:   . Feeling of Stress :   Social Connections:   . Frequency of Communication with Friends and Family:   . Frequency of Social Gatherings with Friends and Family:   . Attends Religious Services:   . Active Member of Clubs or Organizations:   . Attends Banker Meetings:   Marland Kitchen Marital Status:   Intimate Partner Violence:   . Fear of Current or Ex-Partner:   . Emotionally Abused:   Marland Kitchen Physically Abused:   . Sexually Abused:     Allergies:  No Known Allergies  Medications: Prior to Admission medications   Not on File    Physical Exam Vitals: Blood pressure 120/80, height 5\' 2"  (1.575 m), weight 108 lb (49 kg).  General: NAD HEENT: normocephalic, anicteric Thyroid: no enlargement, no palpable nodules Pulmonary: No increased work of breathing, CTAB Cardiovascular: RRR, distal pulses 2+ Breast: Breast symmetrical, no tenderness, no palpable nodules or masses, no skin or nipple retraction present, no nipple discharge.  No axillary or supraclavicular lymphadenopathy. Abdomen: NABS, soft, non-tender, non-distended.  Umbilicus without lesions.  No hepatomegaly, splenomegaly or masses palpable. No evidence of hernia  Genitourinary: deferred for no concerns/PAP interval Extremities: no edema, erythema, or tenderness Neurologic: Grossly intact Psychiatric: mood appropriate, affect full     Assessment: 54 y.o. 46 routine annual exam  Plan: Problem List Items Addressed This Visit    None    Visit Diagnoses    Well woman exam without gynecological exam    -  Primary   Relevant Orders   Hgb A1c w/o eAG   CBC with Differential/Platelet   Comprehensive metabolic panel   Lipid Panel With LDL/HDL Ratio   Blood tests for routine general physical examination       Relevant Orders   Hgb A1c w/o eAG   CBC with Differential/Platelet   Comprehensive metabolic panel    Lipid Panel With LDL/HDL Ratio   Encounter for screening for malignant neoplasm of breast, unspecified screening modality       Relevant Orders   MM DIGITAL SCREENING BILATERAL   Screen for colon cancer       Relevant Orders   Ambulatory referral to Gastroenterology      1) Mammogram - recommend yearly screening mammogram.  Mammogram Was ordered today  2) STI screening  was not offered and therefore not obtained  3) ASCCP guidelines and rational discussed.  Patient opts for every 5 years screening interval  4) Osteoporosis  - per USPTF routine screening DEXA at age 52   Consider FDA-approved medical therapies in postmenopausal women and men aged 50 years and older, based on the following: a) A hip or vertebral (clinical or morphometric) fracture b) T-score ? -2.5 at the femoral neck or spine after appropriate evaluation to exclude secondary causes C) Low bone mass (T-score between -1.0 and -2.5 at the femoral neck or spine) and a 10-year probability of a hip fracture ?  3% or a 10-year probability of a major osteoporosis-related fracture ? 20% based on the [place]-adapted WHO algorithm   5) Routine healthcare maintenance including cholesterol, diabetes screening discussed Ordered today  6) Colonoscopy: Referral sent today.  Screening recommended starting at age 38 for average risk individuals, age 79 for individuals deemed at increased risk (including African Americans) and recommended to continue until age 24.  For patient age 50-85 individualized approach is recommended.  Gold standard screening is via colonoscopy, Cologuard screening is an acceptable alternative for patient unwilling or unable to undergo colonoscopy.  "Colorectal cancer screening for average?risk adults: 2018 guideline update from the American Cancer Society"CA: A Cancer Journal for Clinicians: Nov 16, 2016   7) Return in about 1 year (around 12/05/2020) for annual established gyn.   Parke Poisson, CNM [facility] 12/06/19, 10:44 AM

## 2019-12-08 LAB — LIPID PANEL WITH LDL/HDL RATIO
Cholesterol, Total: 156 mg/dL (ref 100–199)
HDL: 74 mg/dL (ref >39)
LDL Chol Calc (NIH): 71 mg/dL (ref 0–99)
LDL/HDL Ratio: 1 ratio (ref 0.0–3.2)
Triglycerides: 54 mg/dL (ref 0–149)
VLDL Cholesterol Cal: 11 mg/dL (ref 5–40)

## 2019-12-08 LAB — CBC WITH DIFFERENTIAL/PLATELET
Basophils Absolute: 0.1 10*3/uL (ref 0.0–0.2)
Basos: 1 %
EOS (ABSOLUTE): 0.2 10*3/uL (ref 0.0–0.4)
Eos: 3 %
Hematocrit: 39.5 % (ref 34.0–46.6)
Hemoglobin: 13.1 g/dL (ref 11.1–15.9)
Immature Grans (Abs): 0 10*3/uL (ref 0.0–0.1)
Immature Granulocytes: 0 %
Lymphocytes Absolute: 1.4 10*3/uL (ref 0.7–3.1)
Lymphs: 31 %
MCH: 30.3 pg (ref 26.6–33.0)
MCHC: 33.2 g/dL (ref 31.5–35.7)
MCV: 91 fL (ref 79–97)
Monocytes Absolute: 0.5 10*3/uL (ref 0.1–0.9)
Monocytes: 10 %
Neutrophils Absolute: 2.5 10*3/uL (ref 1.4–7.0)
Neutrophils: 55 %
Platelets: 192 10*3/uL (ref 150–450)
RBC: 4.33 x10E6/uL (ref 3.77–5.28)
RDW: 11.8 % (ref 11.7–15.4)
WBC: 4.6 10*3/uL (ref 3.4–10.8)

## 2019-12-08 LAB — COMPREHENSIVE METABOLIC PANEL
ALT: 14 IU/L (ref 0–32)
AST: 26 IU/L (ref 0–40)
Albumin/Globulin Ratio: 2 (ref 1.2–2.2)
Albumin: 4.5 g/dL (ref 3.8–4.9)
Alkaline Phosphatase: 53 IU/L (ref 48–121)
BUN/Creatinine Ratio: 21 (ref 9–23)
BUN: 16 mg/dL (ref 6–24)
Bilirubin Total: 0.2 mg/dL (ref 0.0–1.2)
CO2: 20 mmol/L (ref 20–29)
Calcium: 9.2 mg/dL (ref 8.7–10.2)
Chloride: 103 mmol/L (ref 96–106)
Creatinine, Ser: 0.76 mg/dL (ref 0.57–1.00)
GFR calc Af Amer: 105 mL/min/{1.73_m2} (ref >59)
GFR calc non Af Amer: 91 mL/min/{1.73_m2} (ref >59)
Globulin, Total: 2.3 g/dL (ref 1.5–4.5)
Glucose: 76 mg/dL (ref 65–99)
Potassium: 4.5 mmol/L (ref 3.5–5.2)
Sodium: 146 mmol/L — ABNORMAL HIGH (ref 134–144)
Total Protein: 6.8 g/dL (ref 6.0–8.5)

## 2019-12-08 LAB — HGB A1C W/O EAG: Hgb A1c MFr Bld: 5.1 % (ref 4.8–5.6)

## 2019-12-12 ENCOUNTER — Other Ambulatory Visit: Payer: Self-pay

## 2019-12-12 ENCOUNTER — Telehealth (INDEPENDENT_AMBULATORY_CARE_PROVIDER_SITE_OTHER): Payer: Self-pay | Admitting: Gastroenterology

## 2019-12-12 DIAGNOSIS — Z1211 Encounter for screening for malignant neoplasm of colon: Secondary | ICD-10-CM

## 2019-12-12 NOTE — Progress Notes (Signed)
Gastroenterology Pre-Procedure Review  Request Date: (PENDING) Patient just started new job doesn't know her schedule yet Requesting Physician: Dr.   PATIENT REVIEW QUESTIONS: The patient responded to the following health history questions as indicated:    1. Are you having any GI issues? no 2. Do you have a personal history of Polyps? no 3. Do you have a family history of Colon Cancer or Polyps? maternal grandfather colon cancer 4. Diabetes Mellitus? no 5. Joint replacements in the past 12 months?no 6. Major health problems in the past 3 months?no 7. Any artificial heart valves, MVP, or defibrillator?no    MEDICATIONS & ALLERGIES:    Patient reports the following regarding taking any anticoagulation/antiplatelet therapy:   Plavix, Coumadin, Eliquis, Xarelto, Lovenox, Pradaxa, Brilinta, or Effient? no Aspirin? no  Patient confirms/reports the following medications:  No current outpatient medications on file.   No current facility-administered medications for this visit.    Patient confirms/reports the following allergies:  No Known Allergies  No orders of the defined types were placed in this encounter.   AUTHORIZATION INFORMATION Primary Insurance: 1D#: Group #:  Secondary Insurance: 1D#: Group #:  SCHEDULE INFORMATION: Date: Pending  Time: Location:[facility]

## 2019-12-17 ENCOUNTER — Ambulatory Visit
Admission: RE | Admit: 2019-12-17 | Discharge: 2019-12-17 | Disposition: A | Discharge status: Home / Self Care | Payer: BC Managed Care – PPO | Source: Ambulatory Visit | Attending: Advanced Practice Midwife | Admitting: Advanced Practice Midwife

## 2019-12-17 DIAGNOSIS — Z1239 Encounter for other screening for malignant neoplasm of breast: Secondary | ICD-10-CM

## 2019-12-17 DIAGNOSIS — Z1231 Encounter for screening mammogram for malignant neoplasm of breast: Secondary | ICD-10-CM | POA: Insufficient documentation

## 2019-12-19 ENCOUNTER — Other Ambulatory Visit: Payer: Self-pay | Admitting: Advanced Practice Midwife

## 2019-12-19 DIAGNOSIS — N6320 Unspecified lump in the left breast, unspecified quadrant: Secondary | ICD-10-CM

## 2019-12-19 DIAGNOSIS — R928 Other abnormal and inconclusive findings on diagnostic imaging of breast: Secondary | ICD-10-CM

## 2019-12-30 ENCOUNTER — Ambulatory Visit
Admission: RE | Admit: 2019-12-30 | Discharge: 2019-12-30 | Disposition: A | Discharge status: Home / Self Care | Payer: BC Managed Care – PPO | Source: Ambulatory Visit | Attending: Advanced Practice Midwife | Admitting: Advanced Practice Midwife

## 2019-12-30 DIAGNOSIS — N6320 Unspecified lump in the left breast, unspecified quadrant: Secondary | ICD-10-CM | POA: Insufficient documentation

## 2019-12-30 DIAGNOSIS — R928 Other abnormal and inconclusive findings on diagnostic imaging of breast: Secondary | ICD-10-CM | POA: Diagnosis present

## 2020-09-16 ENCOUNTER — Telehealth (INDEPENDENT_AMBULATORY_CARE_PROVIDER_SITE_OTHER): Payer: Self-pay | Admitting: Gastroenterology

## 2020-09-16 ENCOUNTER — Other Ambulatory Visit: Payer: Self-pay

## 2020-09-16 DIAGNOSIS — Z1211 Encounter for screening for malignant neoplasm of colon: Secondary | ICD-10-CM

## 2020-09-16 MED ORDER — PEG 3350-KCL-NA BICARB-NACL 420 G PO SOLR: 4000.0000 mL | Freq: Once | ORAL | 0 refills | Status: AC

## 2020-09-16 NOTE — Progress Notes (Signed)
Gastroenterology Pre-Procedure Review  Request Date: Thursday 09/24/20 Requesting Physician: Dr. Allegra Lai  PATIENT REVIEW QUESTIONS: The patient responded to the following health history questions as indicated:    1. Are you having any GI issues? no 2. Do you have a personal history of Polyps? no 3. Do you have a family history of Colon Cancer or Polyps? yes (Maternal Grandpa colon cancer) 4. Diabetes Mellitus? no 5. Joint replacements in the past 12 months?no 6. Major health problems in the past 3 months?no 7. Any artificial heart valves, MVP, or defibrillator?no    MEDICATIONS & ALLERGIES:    Patient reports the following regarding taking any anticoagulation/antiplatelet therapy:   Plavix, Coumadin, Eliquis, Xarelto, Lovenox, Pradaxa, Brilinta, or Effient? no Aspirin? no  Patient confirms/reports the following medications:  No current outpatient medications on file.   No current facility-administered medications for this visit.    Patient confirms/reports the following allergies:  No Known Allergies  No orders of the defined types were placed in this encounter.   AUTHORIZATION INFORMATION Primary Insurance: 1D#: Group #:  Secondary Insurance: 1D#: Group #:  SCHEDULE INFORMATION: Date: Thursday 09/24/20 Time: Location:[facility]

## 2020-09-21 ENCOUNTER — Telehealth: Payer: Self-pay | Admitting: Gastroenterology

## 2020-09-21 ENCOUNTER — Telehealth: Payer: Self-pay

## 2020-09-21 NOTE — Telephone Encounter (Signed)
Patient states  She was referred a year ago but never had to colonoscopy but we mailed her the paperwork and paper prescription. She states when she went to the pharmacy to get the prescription it is a gallon jug and not the small containers of prep. Informed patient that is all her insurance would cover. She verbalized understanding and states she can do procedure. She states that she also wants to know where she goes for her colonoscopy informed her [facility]

## 2020-09-21 NOTE — Telephone Encounter (Signed)
Patient has been advised that she will need to [facility] the morning of her procedure.  She said her other questions were addressed by Morrie Sheldon.  Thanks,  [place], [place]

## 2020-09-21 NOTE — Telephone Encounter (Signed)
Please call patient, she has multiple questions about her upcoming procedure on 09/24/20.

## 2020-09-22 ENCOUNTER — Other Ambulatory Visit: Payer: Self-pay

## 2020-09-22 ENCOUNTER — Other Ambulatory Visit
Admission: RE | Admit: 2020-09-22 | Discharge: 2020-09-22 | Disposition: A | Discharge status: Home / Self Care | Payer: BC Managed Care – PPO | Source: Ambulatory Visit | Attending: Gastroenterology | Admitting: Gastroenterology

## 2020-09-22 DIAGNOSIS — Z1211 Encounter for screening for malignant neoplasm of colon: Secondary | ICD-10-CM | POA: Diagnosis present

## 2020-09-22 DIAGNOSIS — Z20822 Contact with and (suspected) exposure to covid-19: Secondary | ICD-10-CM | POA: Insufficient documentation

## 2020-09-22 DIAGNOSIS — Z01812 Encounter for preprocedural laboratory examination: Secondary | ICD-10-CM | POA: Insufficient documentation

## 2020-09-22 DIAGNOSIS — Z8042 Family history of malignant neoplasm of prostate: Secondary | ICD-10-CM | POA: Diagnosis not present

## 2020-09-22 DIAGNOSIS — Z800 Family history of malignant neoplasm of digestive organs: Secondary | ICD-10-CM | POA: Diagnosis not present

## 2020-09-23 LAB — SARS CORONAVIRUS 2 (TAT 6-24 HRS): SARS Coronavirus 2: NEGATIVE

## 2020-09-24 ENCOUNTER — Encounter
Admission: RE | Disposition: A | Discharge status: Home / Self Care | Payer: Self-pay | Source: Home / Self Care | Attending: Gastroenterology

## 2020-09-24 ENCOUNTER — Other Ambulatory Visit: Payer: Self-pay

## 2020-09-24 ENCOUNTER — Ambulatory Visit: Payer: BC Managed Care – PPO | Admitting: Certified Registered"

## 2020-09-24 ENCOUNTER — Ambulatory Visit
Admission: RE | Admit: 2020-09-24 | Discharge: 2020-09-24 | Disposition: A | Discharge status: Home / Self Care | Payer: BC Managed Care – PPO | Attending: Gastroenterology | Admitting: Gastroenterology

## 2020-09-24 ENCOUNTER — Encounter: Payer: Self-pay | Admitting: Gastroenterology

## 2020-09-24 DIAGNOSIS — Z20822 Contact with and (suspected) exposure to covid-19: Secondary | ICD-10-CM | POA: Insufficient documentation

## 2020-09-24 DIAGNOSIS — Z1211 Encounter for screening for malignant neoplasm of colon: Secondary | ICD-10-CM | POA: Diagnosis not present

## 2020-09-24 DIAGNOSIS — Z800 Family history of malignant neoplasm of digestive organs: Secondary | ICD-10-CM | POA: Insufficient documentation

## 2020-09-24 DIAGNOSIS — Z8042 Family history of malignant neoplasm of prostate: Secondary | ICD-10-CM | POA: Insufficient documentation

## 2020-09-24 HISTORY — PX: COLONOSCOPY WITH PROPOFOL: SHX5780

## 2020-09-24 LAB — POCT PREGNANCY, URINE: Preg Test, Ur: NEGATIVE

## 2020-09-24 SURGERY — COLONOSCOPY WITH PROPOFOL
Anesthesia: General

## 2020-09-24 MED ORDER — PROPOFOL 10 MG/ML IV BOLUS
INTRAVENOUS | Status: DC | PRN
  Administered 2020-09-24: 30 mg via INTRAVENOUS
  Administered 2020-09-24: 50 mg via INTRAVENOUS

## 2020-09-24 MED ORDER — PROPOFOL 500 MG/50ML IV EMUL
INTRAVENOUS | Status: DC | PRN
  Administered 2020-09-24: 125 ug/kg/min via INTRAVENOUS

## 2020-09-24 MED ORDER — SODIUM CHLORIDE 0.9 % IV SOLN: INTRAVENOUS | Status: DC

## 2020-09-24 MED ORDER — PROPOFOL 500 MG/50ML IV EMUL
INTRAVENOUS | Status: AC
  Filled 2020-09-24: qty 50

## 2020-09-24 MED ORDER — LIDOCAINE HCL (CARDIAC) PF 100 MG/5ML IV SOSY
PREFILLED_SYRINGE | INTRAVENOUS | Status: DC | PRN
  Administered 2020-09-24: 25 mg via INTRAVENOUS

## 2020-09-24 NOTE — Anesthesia Preprocedure Evaluation (Signed)
Anesthesia Evaluation  Patient identified by MRN, date of birth, ID band Patient awake    Reviewed: Allergy & Precautions, H&P , NPO status , Patient's Chart, lab work & pertinent test results  History of Anesthesia Complications Negative for: history of anesthetic complications  Airway Mallampati: II  TM Distance: >3 FB     Dental  (+) Teeth Intact   Pulmonary neg pulmonary ROS, neg sleep apnea, neg COPD, Not current smoker,    breath sounds clear to auscultation       Cardiovascular (-) angina(-) Past MI and (-) Cardiac Stents negative cardio ROS  (-) dysrhythmias  Rhythm:regular Rate:Normal     Neuro/Psych negative neurological ROS  negative psych ROS   GI/Hepatic negative GI ROS, Neg liver ROS,   Endo/Other  negative endocrine ROS  Renal/GU negative Renal ROS  negative genitourinary   Musculoskeletal   Abdominal   Peds  Hematology negative hematology ROS (+)   Anesthesia Other Findings Past Medical History: 2012, 2014: Breast cyst     Comment:  right breast No date: Carpal tunnel syndrome No date: Gout No date: Mastitis  Past Surgical History: 2012: BREAST CYST ASPIRATION; Left  BMI    Body Mass Index: 18.47 kg/m      Reproductive/Obstetrics negative OB ROS                             Anesthesia Physical Anesthesia Plan  ASA: I  Anesthesia Plan: General   Post-op Pain Management:    Induction:   PONV Risk Score and Plan: Propofol infusion and TIVA  Airway Management Planned: Nasal Cannula  Additional Equipment:   Intra-op Plan:   Post-operative Plan:   Informed Consent: I have reviewed the patients History and Physical, chart, labs and discussed the procedure including the risks, benefits and alternatives for the proposed anesthesia with the patient or authorized representative who has indicated his/her understanding and acceptance.     Dental Advisory  Given  Plan Discussed with: Anesthesiologist, CRNA and Surgeon  Anesthesia Plan Comments:         Anesthesia Quick Evaluation

## 2020-09-24 NOTE — Op Note (Signed)
[facility] Gastroenterology Patient Name: Patricia Mercado Procedure Date: 09/24/2020 9:22 AM MRN: [id number] Account #: [id number] Date of Birth: 1965/06/24 Admit Type: Outpatient Age: 55 Room: [facility] ENDO ROOM 3 Gender: Female Note Status: Finalized Procedure:             Colonoscopy Indications:           Screening for colorectal malignant neoplasm, This is                         the patient's first colonoscopy Providers:             Toney Reil MD, MD Referring MD:          Erasmo Downer (Referring MD) Medicines:             General Anesthesia Complications:         No immediate complications. Estimated blood loss: None. Procedure:             Pre-Anesthesia Assessment:                        - Prior to the procedure, a History and Physical was                         performed, and patient medications and allergies were                         reviewed. The patient is competent. The risks and                         benefits of the procedure and the sedation options and                         risks were discussed with the patient. All questions                         were answered and informed consent was obtained.                         Patient identification and proposed procedure were                         verified by the physician, the nurse, the                         anesthesiologist, the anesthetist and the technician                         in the pre-procedure area in the procedure room in the                         endoscopy suite. Mental Status Examination: alert and                         oriented. Airway Examination: normal oropharyngeal                         airway and neck mobility. Respiratory Examination:  clear to auscultation. CV Examination: normal.                         Prophylactic Antibiotics: The patient does not require                         prophylactic antibiotics. Prior Anticoagulants:  The                         patient has taken no previous anticoagulant or                         antiplatelet agents. ASA Grade Assessment: I - A                         normal, healthy patient. After reviewing the risks and                         benefits, the patient was deemed in satisfactory                         condition to undergo the procedure. The anesthesia                         plan was to use general anesthesia. Immediately prior                         to administration of medications, the patient was                         re-assessed for adequacy to receive sedatives. The                         heart rate, respiratory rate, oxygen saturations,                         blood pressure, adequacy of pulmonary ventilation, and                         response to care were monitored throughout the                         procedure. The physical status of the patient was                         re-assessed after the procedure.                        After obtaining informed consent, the colonoscope was                         passed under direct vision. Throughout the procedure,                         the patient's blood pressure, pulse, and oxygen                         saturations were monitored continuously. The  Colonoscope was introduced through the anus and                         advanced to the the terminal ileum, with                         identification of the appendiceal orifice and IC                         valve. The colonoscopy was performed with moderate                         difficulty due to a tortuous colon. Successful                         completion of the procedure was aided by applying                         abdominal pressure. The patient tolerated the                         procedure well. The quality of the bowel preparation                         was evaluated using the BBPS [facility] Bowel Preparation                          Scale) with scores of: Right Colon = 3, Transverse                         Colon = 3 and Left Colon = 3 (entire mucosa seen well                         with no residual staining, small fragments of stool or                         opaque liquid). The total BBPS score equals 9. Findings:      The perianal and digital rectal examinations were normal. Pertinent       negatives include normal sphincter tone and no palpable rectal lesions.      The terminal ileum appeared normal.      The entire examined colon appeared normal.      The retroflexed view of the distal rectum and anal verge was normal and       showed no anal or rectal abnormalities. Impression:            - The examined portion of the ileum was normal.                        - The entire examined colon is normal.                        - The distal rectum and anal verge are normal on                         retroflexion view.                        -  No specimens collected. Recommendation:        - Discharge patient to home (with escort).                        - Resume previous diet today.                        - Continue present medications.                        - Repeat colonoscopy in 10 years for screening                         purposes. Procedure Code(s):     --- Professional ---                        X9024, Colorectal cancer screening; colonoscopy on                         individual not meeting criteria for high risk Diagnosis Code(s):     --- Professional ---                        Z12.11, Encounter for screening for malignant neoplasm                         of colon CPT copyright 2019 American Medical Association. All rights reserved. The codes documented in this report are preliminary and upon coder review may  be revised to meet current compliance requirements. Dr. Libby Maw Toney Reil MD, MD 09/24/2020 9:57:13 AM This report has been signed electronically. Number of Addenda: 0 Note  Initiated On: 09/24/2020 9:22 AM Scope Withdrawal Time: 0 hours 9 minutes 24 seconds  Total Procedure Duration: 0 hours 20 minutes 44 seconds  Estimated Blood Loss:  Estimated blood loss: none.      [facility]

## 2020-09-24 NOTE — Transfer of Care (Signed)
Immediate Anesthesia Transfer of Care Note  Patient: Patricia Mercado  Procedure(s) Performed: COLONOSCOPY WITH PROPOFOL (N/A )  Patient Location: PACU and Endoscopy Unit  Anesthesia Type:General  Level of Consciousness: drowsy and patient cooperative  Airway & Oxygen Therapy: Patient Spontanous Breathing  Post-op Assessment: Report given to RN and Post -op Vital signs reviewed and stable  Post vital signs: Reviewed and stable  Last Vitals:  Vitals Value Taken Time  BP 104/53   Temp 36.7 C 09/24/20 0959  Pulse 65 09/24/20 0959  Resp 31 09/24/20 0959  SpO2 100 % 09/24/20 0959    Last Pain:  Vitals:   09/24/20 0959  TempSrc: Temporal  PainSc:          Complications: No complications documented.

## 2020-09-24 NOTE — H&P (Signed)
  Arlyss Repress, MD [address]  [address]  [place], [place] [address]  Main: [phone]  Fax: [phone] Pager: [phone]  Primary Care Physician:  Erasmo Downer, NP Primary Gastroenterologist:  Dr. Arlyss Repress  Pre-Procedure History & Physical: HPI:  Patricia Mercado is a 55 y.o. female is here for an colonoscopy.   Past Medical History:  Diagnosis Date  . Breast cyst 2012, 2014   right breast  . Carpal tunnel syndrome   . Gout   . Mastitis     Past Surgical History:  Procedure Laterality Date  . BREAST CYST ASPIRATION Left 2012    Prior to Admission medications   Not on File    Allergies as of 09/16/2020  . (No Known Allergies)    Family History  Problem Relation Age of Onset  . Cancer Maternal Grandfather        colon  . Prostate cancer Father   . Breast cancer Neg Hx     Social History   Socioeconomic History  . Marital status: Married    Spouse name: Not on file  . Number of children: Not on file  . Years of education: Not on file  . Highest education level: Not on file  Occupational History  . Not on file  Tobacco Use  . Smoking status: Never Smoker  . Smokeless tobacco: Never Used  Vaping Use  . Vaping Use: Never used  Substance and Sexual Activity  . Alcohol use: No  . Drug use: No  . Sexual activity: Yes    Birth control/protection: Condom  Other Topics Concern  . Not on file  Social History Narrative  . Not on file   Social Determinants of Health   Financial Resource Strain: Not on file  Food Insecurity: Not on file  Transportation Needs: Not on file  Physical Activity: Not on file  Stress: Not on file  Social Connections: Not on file  Intimate Partner Violence: Not on file    Review of Systems: See HPI, otherwise negative ROS  Physical Exam: BP (!) 122/58   Pulse 64   Temp 98 F (36.7 C) (Temporal)   Resp 18   Ht 5\' 2"  (1.575 m)   Wt 45.8 kg   LMP 12/19/2019   SpO2 100%   BMI 18.47 kg/m   General:   Alert,  pleasant and cooperative in NAD Head:  Normocephalic and atraumatic. Neck:  Supple; no masses or thyromegaly. Lungs:  Clear throughout to auscultation.    Heart:  Regular rate and rhythm. Abdomen:  Soft, nontender and nondistended. Normal bowel sounds, without guarding, and without rebound.   Neurologic:  Alert and  oriented x4;  grossly normal neurologically.  Impression/Plan: Patricia Mercado is here for an colonoscopy to be performed for colon cancer screening  Risks, benefits, limitations, and alternatives regarding  colonoscopy have been reviewed with the patient.  Questions have been answered.  All parties agreeable.   Idelle Leech, MD  09/24/2020, 8:59 AM

## 2020-09-24 NOTE — Progress Notes (Signed)
No risk at this time. 

## 2020-09-24 NOTE — Anesthesia Procedure Notes (Signed)
Procedure Name: MAC Date/Time: 09/24/2020 9:30 AM Performed by: Jerrye Noble, CRNA Pre-anesthesia Checklist: Patient identified, Emergency Drugs available, Suction available and Patient being monitored Patient Re-evaluated:Patient Re-evaluated prior to induction Oxygen Delivery Method: Nasal cannula

## 2020-09-24 NOTE — Anesthesia Postprocedure Evaluation (Signed)
Anesthesia Post Note  Patient: Patricia Mercado  Procedure(s) Performed: COLONOSCOPY WITH PROPOFOL (N/A )  Patient location during evaluation: PACU Anesthesia Type: General Level of consciousness: awake and alert Pain management: pain level controlled Vital Signs Assessment: post-procedure vital signs reviewed and stable Respiratory status: spontaneous breathing, nonlabored ventilation and respiratory function stable Cardiovascular status: blood pressure returned to baseline and stable Postop Assessment: no apparent nausea or vomiting Anesthetic complications: no   No complications documented.   Last Vitals:  Vitals:   09/24/20 1019 09/24/20 1029  BP: (!) 115/59 (!) 118/59  Pulse: 61 (!) 58  Resp: 15 (!) 25  Temp:    SpO2: 100% 100%    Last Pain:  Vitals:   09/24/20 1029  TempSrc:   PainSc: 0-No pain                 Aurelio Brash Marise Knapper

## 2020-09-25 ENCOUNTER — Encounter: Payer: Self-pay | Admitting: Gastroenterology

## 2020-12-15 ENCOUNTER — Other Ambulatory Visit: Payer: Self-pay

## 2020-12-15 ENCOUNTER — Ambulatory Visit (INDEPENDENT_AMBULATORY_CARE_PROVIDER_SITE_OTHER): Payer: BC Managed Care – PPO | Admitting: Advanced Practice Midwife

## 2020-12-15 ENCOUNTER — Encounter: Payer: Self-pay | Admitting: Advanced Practice Midwife

## 2020-12-15 VITALS — BP 112/58 | HR 62 | Ht 62.0 in | Wt 106.0 lb

## 2020-12-15 DIAGNOSIS — Z0000 Encounter for general adult medical examination without abnormal findings: Secondary | ICD-10-CM

## 2020-12-15 DIAGNOSIS — Z1239 Encounter for other screening for malignant neoplasm of breast: Secondary | ICD-10-CM

## 2020-12-15 NOTE — Progress Notes (Signed)
Gynecology Annual Exam  PCP: Erasmo Downer, NP  Chief Complaint:  Chief Complaint  Patient presents with   Gynecologic Exam    Annual - no concerns. RM 5    History of Present Illness:Patient is a 55 y.o. [id number] presents for annual exam. The patient has no gyn complaints today.   LMP: No LMP recorded. Patient is perimenopausal.  She has not had a period since the end of August last year. She denies hot flashes or vaginal dryness. She has some emotional lability. Postcoital Bleeding: no Dysmenorrhea: not applicable  The patient is sexually active. She denies dyspareunia.  The patient does perform self breast exams.  There is no notable family history of breast or ovarian cancer in her family.  The patient wears seatbelts: yes.   The patient has regular exercise: she admits a decrease in exercise in past year. She admits she could increase fruit, vegetable and water intake. She admits adequate sleep. She is unsure if she has adequate calcium and vitamin D in her diet. Discussed the importance of adequate intake of them plus weight bearing exercise for bone health. She denies a family history of osteoporosis.   The patient denies current symptoms of depression.     Review of Systems: Review of Systems  Constitutional:  Negative for chills and fever.  HENT:  Negative for congestion, ear discharge, ear pain, hearing loss, sinus pain and sore throat.   Eyes:  Negative for blurred vision and double vision.  Respiratory:  Negative for cough, shortness of breath and wheezing.   Cardiovascular:  Negative for chest pain, palpitations and leg swelling.  Gastrointestinal:  Negative for abdominal pain, blood in stool, constipation, diarrhea, heartburn, melena, nausea and vomiting.  Genitourinary:  Negative for dysuria, flank pain, frequency, hematuria and urgency.  Musculoskeletal:  Negative for back pain, joint pain and myalgias.  Skin:  Negative for itching and rash.   Neurological:  Negative for dizziness, tingling, tremors, sensory change, speech change, focal weakness, seizures, loss of consciousness, weakness and headaches.  Endo/Heme/Allergies:  Negative for environmental allergies. Does not bruise/bleed easily.  Psychiatric/Behavioral:  Negative for depression, hallucinations, memory loss, substance abuse and suicidal ideas. The patient is not nervous/anxious and does not have insomnia.    Past Medical History:  Patient Active Problem List   Diagnosis Date Noted   Encounter for screening colonoscopy    Breast cyst 12/31/2012    Past Surgical History:  Past Surgical History:  Procedure Laterality Date   BREAST CYST ASPIRATION Left 2012   COLONOSCOPY WITH PROPOFOL N/A 09/24/2020   Procedure: COLONOSCOPY WITH PROPOFOL;  Surgeon: Toney Reil, MD;  Location: [facility] ENDOSCOPY;  Service: Gastroenterology;  Laterality: N/A;    Gynecologic History:  No LMP recorded. Patient is perimenopausal. Last Pap: 2 years ago Results were:  no abnormalities  Last mammogram: 1 year ago Results were: BI-RAD II benign  Obstetric History: [id number]  Family History:  Family History  Problem Relation Age of Onset   Cancer Maternal Grandfather        colon   Prostate cancer Father    Breast cancer Neg Hx     Social History:  Social History   Socioeconomic History   Marital status: Married    Spouse name: Not on file   Number of children: Not on file   Years of education: Not on file   Highest education level: Not on file  Occupational History   Not on file  Tobacco Use  Smoking status: Never   Smokeless tobacco: Never  Vaping Use   Vaping Use: Never used  Substance and Sexual Activity   Alcohol use: No   Drug use: No   Sexual activity: Yes    Birth control/protection: Condom  Other Topics Concern   Not on file  Social History Narrative   Not on file   Social Determinants of Health   Financial Resource Strain: Not on file  Food  Insecurity: Not on file  Transportation Needs: Not on file  Physical Activity: Not on file  Stress: Not on file  Social Connections: Not on file  Intimate Partner Violence: Not on file    Allergies:  No Known Allergies  Medications: Prior to Admission medications   Not on File    Physical Exam Vitals: Blood pressure (!) 112/58, pulse 62, height 5\' 2"  (1.575 m), weight 106 lb (48.1 kg).  General: NAD HEENT: normocephalic, anicteric Thyroid: no enlargement, no palpable nodules Pulmonary: No increased work of breathing, CTAB Cardiovascular: RRR, distal pulses 2+ Breast: Breast symmetrical, no tenderness, no palpable nodules or masses, no skin or nipple retraction present, no nipple discharge.  No axillary or supraclavicular lymphadenopathy. Abdomen: NABS, soft, non-tender, non-distended.  Umbilicus without lesions.  No hepatomegaly, splenomegaly or masses palpable. No evidence of hernia  Genitourinary: deferred for no concerns/PAP interval Extremities: no edema, erythema, or tenderness Neurologic: Grossly intact Psychiatric: mood appropriate, affect full    Assessment: 55 y.o. 46 routine annual exam  Plan: Problem List Items Addressed This Visit   None Visit Diagnoses     Encounter for well woman exam without gynecological exam    -  Primary   Relevant Orders   MM DIGITAL SCREENING BILATERAL   Screening breast examination       Relevant Orders   MM DIGITAL SCREENING BILATERAL       1) Mammogram - recommend yearly screening mammogram.  Mammogram Was ordered today  2) STI screening  wasoffered and declined  3) ASCCP guidelines and rationale discussed.  Patient opts for every 3 years screening interval  4) Osteoporosis  - per USPTF routine screening DEXA at age 78  Consider FDA-approved medical therapies in postmenopausal women and men aged 48 years and older, based on the following: a) A hip or vertebral (clinical or morphometric) fracture b) T-score ? -2.5  at the femoral neck or spine after appropriate evaluation to exclude secondary causes C) Low bone mass (T-score between -1.0 and -2.5 at the femoral neck or spine) and a 10-year probability of a hip fracture ? 3% or a 10-year probability of a major osteoporosis-related fracture ? 20% based on the [place]-adapted WHO algorithm   5) Routine healthcare maintenance including cholesterol, diabetes screening discussed Declines  6) Colonoscopy up to date done in April 2022.  Screening recommended starting at age 41 for average risk individuals, age 24 for individuals deemed at increased risk (including African Americans) and recommended to continue until age 64.  For patient age 60-85 individualized approach is recommended.  Gold standard screening is via colonoscopy, Cologuard screening is an acceptable alternative for patient unwilling or unable to undergo colonoscopy.  "Colorectal cancer screening for average?risk adults: 2018 guideline update from the American Cancer Society"CA: A Cancer Journal for Clinicians: Nov 16, 2016   7) Return in about 1 year (around 12/15/2021) for annual established gyn.   12/17/2021, CNM [facility] 12/15/20, 3:15 PM

## 2020-12-15 NOTE — Patient Instructions (Signed)
Health Maintenance, Female Adopting a healthy lifestyle and getting preventive care are important in promoting health and wellness. Ask your health care provider about: The right schedule for you to have regular tests and exams. Things you can do on your own to prevent diseases and keep yourself healthy. What should I know about diet, weight, and exercise? Eat a healthy diet  Eat a diet that includes plenty of vegetables, fruits, low-fat dairy products, and lean protein. Do not eat a lot of foods that are high in solid fats, added sugars, or sodium.  Maintain a healthy weight Body mass index (BMI) is used to identify weight problems. It estimates body fat based on height and weight. Your health care provider can help determineyour BMI and help you achieve or maintain a healthy weight. Get regular exercise Get regular exercise. This is one of the most important things you can do for your health. Most adults should: Exercise for at least 150 minutes each week. The exercise should increase your heart rate and make you sweat (moderate-intensity exercise). Do strengthening exercises at least twice a week. This is in addition to the moderate-intensity exercise. Spend less time sitting. Even light physical activity can be beneficial. Watch cholesterol and blood lipids Have your blood tested for lipids and cholesterol at 53 years of age, then havethis test every 5 years. Have your cholesterol levels checked more often if: Your lipid or cholesterol levels are high. You are older than 53 years of age. You are at high risk for heart disease. What should I know about cancer screening? Depending on your health history and family history, you may need to have cancer screening at various ages. This may include screening for: Breast cancer. Cervical cancer. Colorectal cancer. Skin cancer. Lung cancer. What should I know about heart disease, diabetes, and high blood pressure? Blood pressure and heart  disease High blood pressure causes heart disease and increases the risk of stroke. This is more likely to develop in people who have high blood pressure readings, are of African descent, or are overweight. Have your blood pressure checked: Every 3-5 years if you are 18-39 years of age. Every year if you are 40 years old or older. Diabetes Have regular diabetes screenings. This checks your fasting blood sugar level. Have the screening done: Once every three years after age 40 if you are at a normal weight and have a low risk for diabetes. More often and at a younger age if you are overweight or have a high risk for diabetes. What should I know about preventing infection? Hepatitis B If you have a higher risk for hepatitis B, you should be screened for this virus. Talk with your health care provider to find out if you are at risk forhepatitis B infection. Hepatitis C Testing is recommended for: Everyone born from 1945 through 1965. Anyone with known risk factors for hepatitis C. Sexually transmitted infections (STIs) Get screened for STIs, including gonorrhea and chlamydia, if: You are sexually active and are younger than 53 years of age. You are older than 53 years of age and your health care provider tells you that you are at risk for this type of infection. Your sexual activity has changed since you were last screened, and you are at increased risk for chlamydia or gonorrhea. Ask your health care provider if you are at risk. Ask your health care provider about whether you are at high risk for HIV. Your health care provider may recommend a prescription medicine to help   prevent HIV infection. If you choose to take medicine to prevent HIV, you should first get tested for HIV. You should then be tested every 3 months for as long as you are taking the medicine. Pregnancy If you are about to stop having your period (premenopausal) and you may become pregnant, seek counseling before you get  pregnant. Take 400 to 800 micrograms (mcg) of folic acid every day if you become pregnant. Ask for birth control (contraception) if you want to prevent pregnancy. Osteoporosis and menopause Osteoporosis is a disease in which the bones lose minerals and strength with aging. This can result in bone fractures. If you are 65 years old or older, or if you are at risk for osteoporosis and fractures, ask your health care provider if you should: Be screened for bone loss. Take a calcium or vitamin D supplement to lower your risk of fractures. Be given hormone replacement therapy (HRT) to treat symptoms of menopause. Follow these instructions at home: Lifestyle Do not use any products that contain nicotine or tobacco, such as cigarettes, e-cigarettes, and chewing tobacco. If you need help quitting, ask your health care provider. Do not use street drugs. Do not share needles. Ask your health care provider for help if you need support or information about quitting drugs. Alcohol use Do not drink alcohol if: Your health care provider tells you not to drink. You are pregnant, may be pregnant, or are planning to become pregnant. If you drink alcohol: Limit how much you use to 0-1 drink a day. Limit intake if you are breastfeeding. Be aware of how much alcohol is in your drink. In the [place] one drink equals one 12 oz bottle of beer (355 mL), one 5 oz glass of wine (148 mL), or one 1 oz glass of hard liquor (44 mL). General instructions Schedule regular health, dental, and eye exams. Stay current with your vaccines. Tell your health care provider if: You often feel depressed. You have ever been abused or do not feel safe at home. Summary Adopting a healthy lifestyle and getting preventive care are important in promoting health and wellness. Follow your health care provider's instructions about healthy diet, exercising, and getting tested or screened for diseases. Follow your health care provider's  instructions on monitoring your cholesterol and blood pressure. This information is not intended to replace advice given to you by your health care provider. Make sure you discuss any questions you have with your healthcare provider. Document Revised: 05/30/2018 Document Reviewed: 05/30/2018 Elsevier Patient Education  2022 Elsevier Inc.  

## 2021-01-01 ENCOUNTER — Ambulatory Visit
Admission: RE | Admit: 2021-01-01 | Discharge: 2021-01-01 | Disposition: A | Discharge status: Home / Self Care | Payer: BC Managed Care – PPO | Source: Ambulatory Visit | Attending: Advanced Practice Midwife | Admitting: Advanced Practice Midwife

## 2021-01-01 ENCOUNTER — Other Ambulatory Visit: Payer: Self-pay

## 2021-01-01 DIAGNOSIS — Z0000 Encounter for general adult medical examination without abnormal findings: Secondary | ICD-10-CM

## 2021-01-01 DIAGNOSIS — Z1231 Encounter for screening mammogram for malignant neoplasm of breast: Secondary | ICD-10-CM | POA: Diagnosis not present

## 2021-01-01 DIAGNOSIS — Z1239 Encounter for other screening for malignant neoplasm of breast: Secondary | ICD-10-CM

## 2021-11-08 ENCOUNTER — Other Ambulatory Visit: Payer: Self-pay | Admitting: Advanced Practice Midwife

## 2021-11-08 DIAGNOSIS — Z1231 Encounter for screening mammogram for malignant neoplasm of breast: Secondary | ICD-10-CM

## 2021-12-01 ENCOUNTER — Ambulatory Visit: Payer: BC Managed Care – PPO | Admitting: Family Medicine

## 2021-12-17 ENCOUNTER — Ambulatory Visit: Payer: BC Managed Care – PPO | Admitting: Family Medicine

## 2021-12-20 ENCOUNTER — Ambulatory Visit: Payer: BC Managed Care – PPO | Admitting: Advanced Practice Midwife

## 2021-12-27 ENCOUNTER — Other Ambulatory Visit: Payer: Self-pay | Admitting: Obstetrics and Gynecology

## 2021-12-27 DIAGNOSIS — Z1231 Encounter for screening mammogram for malignant neoplasm of breast: Secondary | ICD-10-CM

## 2021-12-31 ENCOUNTER — Ambulatory Visit: Payer: BC Managed Care – PPO | Admitting: Advanced Practice Midwife

## 2022-01-04 ENCOUNTER — Ambulatory Visit: Payer: BC Managed Care – PPO | Admitting: Advanced Practice Midwife

## 2022-01-11 ENCOUNTER — Ambulatory Visit: Payer: BC Managed Care – PPO | Admitting: Advanced Practice Midwife

## 2022-01-18 ENCOUNTER — Ambulatory Visit
Admission: RE | Admit: 2022-01-18 | Discharge: 2022-01-18 | Disposition: A | Discharge status: Home / Self Care | Payer: BC Managed Care – PPO | Source: Ambulatory Visit | Attending: Obstetrics and Gynecology | Admitting: Obstetrics and Gynecology

## 2022-01-18 DIAGNOSIS — Z1231 Encounter for screening mammogram for malignant neoplasm of breast: Secondary | ICD-10-CM | POA: Diagnosis not present

## 2022-01-19 ENCOUNTER — Other Ambulatory Visit: Payer: Self-pay | Admitting: Obstetrics and Gynecology

## 2022-02-03 NOTE — H&P (Signed)
Preoperative History and Physical  Patricia Mercado is a 56 y.o. [id number] here for surgical management of postmenopausal bleeding with endometrial polyp found on endometrial biopsy.   No significant preoperative concerns.  History of Present Illness: 56 y.o. [id number] female who presented for postmenopausal bleeding.  No period since August 2021. In early June she had some spotting that gradually became more red. The bleeding was always light and lasted about 5 days. About 2 weeks prior to this she was having a little bit of abnormal discharge.   Pelvic ultrasound on 01/11/2022: Ultrasound demonstrates the following findings Adnexa: no masses seen  Uterus: anteverted with endometrial stripe  4.6 mm Additional: a couple of very small fibroids  Endometrial biopsy on 01/11/2022:  BENIGN ENDOMETRIAL POLYP.   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 Lannette Donath   OB History  Gravida Para Term Preterm AB Living  4 4 4     4   SAB IAB Ectopic Molar Multiple Live Births            4    # Outcome Date GA Lbr Len/2nd Weight Sex Delivery Anes PTL Lv  4 Term         LIV  3 Term         LIV  2 Term         LIV  1 Term         LIV  Patient denies any other pertinent gynecologic issues.   Current Outpatient Medications on File Prior to Visit  Medication Sig Dispense Refill   cetirizine (ZYRTEC) 10 MG tablet Take 10 mg by mouth once daily as needed for Allergies     No  current facility-administered medications on file prior to visit.   No Known Allergies  Social History:   reports that she has never smoked. She has never used smokeless tobacco. She reports that she does not currently use alcohol. She reports that she does not use drugs.  Family History  Problem Relation Age of Onset   No Known Problems Mother    Prostate cancer Father    Colon cancer Maternal Grandfather    Gout Paternal Grandfather     Review of Systems: Noncontributory  PHYSICAL EXAM: Blood pressure 104/66, pulse 65, height 157.5 cm (5\' 2" ), weight 48.5 kg (107 lb), last menstrual period 08/21/2017. CONSTITUTIONAL: Well-developed, well-nourished female in no acute distress.  HENT:  Normocephalic, atraumatic, External right and left ear normal. Oropharynx is clear and moist EYES: Conjunctivae and EOM are normal. Pupils are equal, round, and reactive to light. No scleral icterus.  NECK: Normal range of motion, supple, no masses SKIN: Skin is warm and dry. No rash noted. Not diaphoretic. No erythema. No pallor. NEUROLGIC: Alert and oriented to person, place, and time. Normal reflexes, muscle tone coordination. No cranial nerve deficit noted. PSYCHIATRIC: Normal mood and affect. Normal behavior. Normal judgment and thought content. CARDIOVASCULAR: Normal heart  rate noted, regular rhythm RESPIRATORY: Effort and breath sounds normal, no problems with respiration noted ABDOMEN: Soft, nontender, nondistended. PELVIC: Deferred MUSCULOSKELETAL: Normal range of motion. No edema and no tenderness. 2+ distal pulses.  Labs: No results found for this or any previous visit (from the past 336 hour(s)).  Imaging Studies: US pelvic transvaginal  Result Date: 01/11/2022 ULTRASOUND REPORT Location: [facility] Date of Service: 01/11/2022 Indications: Postmenopausal bleeding Findings: The uterus is anteverted and measures 6.7 x 3.4 x 3.8 cm. Echo texture is homogenous with evidence of focal  masses. Within the uterus are multiple suspected fibroids measuring: Fibroid 1: 0.8 x 0.6 x .085 cm, mid uterus Fibroid 2: 0.9 x 0.7 x 1 cm, posterior uterus The Endometrium measures 4.56 mm. Right Ovary measures 2.7 x 1.5 x 1.4 cm. It is normal in appearance. Left Ovary measures 2.76 x 2 x 1 cm. It is normal in appearance. Survey of the adnexa demonstrates no adnexal masses. There is no free fluid in the cul de sac. Impression: 1. Uterus with slightly thickened endometrium. 2. Two small fibroids, as noted above. The ultrasound images and findings were reviewed by me and I agree with the above report. Rubye Oaks, MD [facility] OB[facility]N [facility] 01/11/2022 5:00 PM       Assessment: 1. Postmenopausal bleeding   2. Endometrial polyp      Plan: Patient will undergo surgical management with the above noted surgery.   The risks of surgery were discussed in detail with the patient including but not limited to: bleeding which may require transfusion or reoperation; infection which may require antibiotics; injury to surrounding organs which may involve bowel, bladder, ureters ; need for additional procedures including laparoscopy or laparotomy; thromboembolic phenomenon, surgical site problems and other postoperative/anesthesia complications. Likelihood of success in alleviating the patient's condition was discussed. Routine postoperative instructions will be reviewed with the patient and her family in detail after surgery.  The patient concurred with the proposed plan, giving informed written consent for the surgery.  Preoperative prophylactic antibiotics, as indicated, and SCDs ordered on call to the OR.     Attestation Statement:   I personally performed the service. (TP)  Crist Kruszka Teola Bradley, MD  [facility] OB/GYN [facility] 02/03/2022 2:33 PM

## 2022-02-08 ENCOUNTER — Other Ambulatory Visit: Payer: Self-pay

## 2022-02-08 ENCOUNTER — Encounter
Admission: RE | Admit: 2022-02-08 | Discharge: 2022-02-08 | Disposition: A | Discharge status: Home / Self Care | Payer: BC Managed Care – PPO | Source: Ambulatory Visit | Attending: Obstetrics and Gynecology | Admitting: Obstetrics and Gynecology

## 2022-02-08 HISTORY — DX: Anemia, unspecified: D64.9

## 2022-02-08 NOTE — Patient Instructions (Signed)
Your procedure is scheduled on: 02/18/22 - Friday Report to the Registration Desk on the 1st floor of the [facility]. To find out your arrival time, please call [phone] between 1PM - 3PM on: 02/17/22 - Thursday If your arrival time is 6:00 am, do not arrive prior to that time as the [facility] Mall entrance doors do not open until 6:00 am.  REMEMBER: Instructions that are not followed completely may result in serious medical risk, up to and including death; or upon the discretion of your surgeon and anesthesiologist your surgery may need to be rescheduled.  Do not eat food after midnight the night before surgery.  No gum chewing, lozengers or hard candies.  You may however, drink CLEAR liquids up to 2 hours before you are scheduled to arrive for your surgery. Do not drink anything within 2 hours of your scheduled arrival time.  Clear liquids include: - water  - apple juice without pulp - gatorade (not RED colors) - black coffee or tea (Do NOT add milk or creamers to the coffee or tea) Do NOT drink anything that is not on this list.  In addition, your doctor has ordered for you to drink the provided  Ensure Pre-Surgery Clear Carbohydrate Drink  Drinking this carbohydrate drink up to two hours before surgery helps to reduce insulin resistance and improve patient outcomes. Please complete drinking 2 hours prior to scheduled arrival time.  TAKE THESE MEDICATIONS THE MORNING OF SURGERY WITH A SIP OF WATER: NONE  One week prior to surgery: Stop Anti-inflammatories (NSAIDS) such as Advil, Aleve, Ibuprofen, Motrin, Naproxen, Naprosyn and Aspirin based products such as Excedrin, Goodys Powder, BC Powder.  Stop ANY OVER THE COUNTER supplements until after surgery.  You may however, continue to take Tylenol if needed for pain up until the day of surgery.  No Alcohol for 24 hours before or after surgery.  No Smoking including e-cigarettes for 24 hours prior to surgery.  No chewable  tobacco products for at least 6 hours prior to surgery.  No nicotine patches on the day of surgery.  Do not use any "recreational" drugs for at least a week prior to your surgery.  Please be advised that the combination of cocaine and anesthesia may have negative outcomes, up to and including death. If you test positive for cocaine, your surgery will be cancelled.  On the morning of surgery brush your teeth with toothpaste and water, you may rinse your mouth with mouthwash if you wish. Do not swallow any toothpaste or mouthwash.  Do not wear jewelry, make-up, hairpins, clips or nail polish.  Do not wear lotions, powders, or perfumes.   Do not shave body from the neck down 48 hours prior to surgery just in case you cut yourself which could leave a site for infection.  Also, freshly shaved skin may become irritated if using the CHG soap.  Contact lenses, hearing aids and dentures may not be worn into surgery.  Do not bring valuables to the hospital. [facility] is not responsible for any missing/lost belongings or valuables.   Notify your doctor if there is any change in your medical condition (cold, fever, infection).  Wear comfortable clothing (specific to your surgery type) to the hospital.  After surgery, you can help prevent lung complications by doing breathing exercises.  Take deep breaths and cough every 1-2 hours. Your doctor may order a device called an Incentive Spirometer to help you take deep breaths. When coughing or sneezing, hold a pillow  firmly against your incision with both hands. This is called "splinting." Doing this helps protect your incision. It also decreases belly discomfort.  If you are being admitted to the hospital overnight, leave your suitcase in the car. After surgery it may be brought to your room.  If you are being discharged the day of surgery, you will not be allowed to drive home. You will need a responsible adult (18 years or older) to drive you  home and stay with you that night.   If you are taking public transportation, you will need to have a responsible adult (18 years or older) with you. Please confirm with your physician that it is acceptable to use public transportation.   Please call the [place] [facility]. at [phone] if you have any questions about these instructions.  Surgery Visitation Policy:  Patients undergoing a surgery or procedure may have two family members or support persons with them as long as the person is not COVID-19 positive or experiencing its symptoms.   Inpatient Visitation:    Visiting hours are 7 a.m. to 8 p.m. Up to four visitors are allowed at one time in a patient room, including children. The visitors may rotate out with other people during the day. One designated support person (adult) may remain overnight.

## 2022-02-17 MED ORDER — FAMOTIDINE 20 MG PO TABS: 20.0000 mg | ORAL_TABLET | Freq: Once | ORAL | Status: AC

## 2022-02-17 MED ORDER — CHLORHEXIDINE GLUCONATE 0.12 % MT SOLN: 15.0000 mL | Freq: Once | OROMUCOSAL | Status: AC

## 2022-02-17 MED ORDER — ORAL CARE MOUTH RINSE: 15.0000 mL | Freq: Once | OROMUCOSAL | Status: AC

## 2022-02-17 MED ORDER — LACTATED RINGERS IV SOLN: INTRAVENOUS | Status: DC

## 2022-02-18 ENCOUNTER — Other Ambulatory Visit: Payer: Self-pay

## 2022-02-18 ENCOUNTER — Ambulatory Visit: Payer: BC Managed Care – PPO | Admitting: Certified Registered Nurse Anesthetist

## 2022-02-18 ENCOUNTER — Encounter
Admission: RE | Disposition: A | Discharge status: Home / Self Care | Payer: Self-pay | Source: Home / Self Care | Attending: Obstetrics and Gynecology

## 2022-02-18 ENCOUNTER — Ambulatory Visit
Admission: RE | Admit: 2022-02-18 | Discharge: 2022-02-18 | Disposition: A | Discharge status: Home / Self Care | Payer: BC Managed Care – PPO | Attending: Obstetrics and Gynecology | Admitting: Obstetrics and Gynecology

## 2022-02-18 ENCOUNTER — Encounter: Payer: Self-pay | Admitting: Obstetrics and Gynecology

## 2022-02-18 DIAGNOSIS — N950 Postmenopausal bleeding: Secondary | ICD-10-CM | POA: Diagnosis present

## 2022-02-18 DIAGNOSIS — N858 Other specified noninflammatory disorders of uterus: Secondary | ICD-10-CM | POA: Diagnosis not present

## 2022-02-18 DIAGNOSIS — N840 Polyp of corpus uteri: Secondary | ICD-10-CM | POA: Diagnosis present

## 2022-02-18 HISTORY — PX: HYSTEROSCOPY WITH D & C: SHX1775

## 2022-02-18 LAB — TYPE AND SCREEN
ABO/RH(D): A POS
Antibody Screen: NEGATIVE

## 2022-02-18 LAB — ABO/RH: ABO/RH(D): A POS

## 2022-02-18 SURGERY — DILATATION AND CURETTAGE /HYSTEROSCOPY
Anesthesia: General | Site: Uterus

## 2022-02-18 MED ORDER — CHLORHEXIDINE GLUCONATE 0.12 % MT SOLN
OROMUCOSAL | Status: AC
  Administered 2022-02-18: 15 mL via OROMUCOSAL
  Filled 2022-02-18: qty 15

## 2022-02-18 MED ORDER — PROMETHAZINE HCL 25 MG/ML IJ SOLN: 6.2500 mg | INTRAMUSCULAR | Status: DC | PRN

## 2022-02-18 MED ORDER — SILVER NITRATE-POT NITRATE 75-25 % EX MISC
CUTANEOUS | Status: DC | PRN
  Administered 2022-02-18: 2

## 2022-02-18 MED ORDER — ACETAMINOPHEN 10 MG/ML IV SOLN
INTRAVENOUS | Status: DC | PRN
  Administered 2022-02-18: 650 mg via INTRAVENOUS

## 2022-02-18 MED ORDER — FAMOTIDINE 20 MG PO TABS
ORAL_TABLET | ORAL | Status: AC
  Administered 2022-02-18: 20 mg via ORAL
  Filled 2022-02-18: qty 1

## 2022-02-18 MED ORDER — KETOROLAC TROMETHAMINE 30 MG/ML IJ SOLN
INTRAMUSCULAR | Status: DC | PRN
  Administered 2022-02-18: 30 mg via INTRAVENOUS

## 2022-02-18 MED ORDER — MIDAZOLAM HCL 2 MG/2ML IJ SOLN
INTRAMUSCULAR | Status: AC
  Filled 2022-02-18: qty 2

## 2022-02-18 MED ORDER — 0.9 % SODIUM CHLORIDE (POUR BTL) OPTIME
TOPICAL | Status: DC | PRN
  Administered 2022-02-18: 500 mL

## 2022-02-18 MED ORDER — ONDANSETRON HCL 4 MG/2ML IJ SOLN
INTRAMUSCULAR | Status: DC | PRN
  Administered 2022-02-18: 4 mg via INTRAVENOUS

## 2022-02-18 MED ORDER — MIDAZOLAM HCL 2 MG/2ML IJ SOLN
INTRAMUSCULAR | Status: DC | PRN
  Administered 2022-02-18: 2 mg via INTRAVENOUS

## 2022-02-18 MED ORDER — FENTANYL CITRATE (PF) 100 MCG/2ML IJ SOLN: 25.0000 ug | INTRAMUSCULAR | Status: DC | PRN

## 2022-02-18 MED ORDER — SILVER NITRATE-POT NITRATE 75-25 % EX MISC
CUTANEOUS | Status: AC
  Filled 2022-02-18: qty 10

## 2022-02-18 MED ORDER — ACETAMINOPHEN 10 MG/ML IV SOLN
INTRAVENOUS | Status: AC
  Filled 2022-02-18: qty 100

## 2022-02-18 MED ORDER — IBUPROFEN 600 MG PO TABS: 600.0000 mg | ORAL_TABLET | Freq: Four times a day (QID) | ORAL | 0 refills | Status: AC | PRN

## 2022-02-18 MED ORDER — PROPOFOL 10 MG/ML IV BOLUS
INTRAVENOUS | Status: DC | PRN
  Administered 2022-02-18: 150 mg via INTRAVENOUS
  Administered 2022-02-18: 50 mg via INTRAVENOUS

## 2022-02-18 MED ORDER — FENTANYL CITRATE (PF) 100 MCG/2ML IJ SOLN
INTRAMUSCULAR | Status: AC
  Filled 2022-02-18: qty 2

## 2022-02-18 MED ORDER — LIDOCAINE HCL (CARDIAC) PF 100 MG/5ML IV SOSY
PREFILLED_SYRINGE | INTRAVENOUS | Status: DC | PRN
  Administered 2022-02-18: 50 mg via INTRAVENOUS

## 2022-02-18 MED ORDER — DEXAMETHASONE SODIUM PHOSPHATE 10 MG/ML IJ SOLN
INTRAMUSCULAR | Status: DC | PRN
  Administered 2022-02-18: 5 mg via INTRAVENOUS

## 2022-02-18 SURGICAL SUPPLY — 29 items
BAG DRN RND TRDRP ANRFLXCHMBR (UROLOGICAL SUPPLIES)
BAG URINE DRAIN 2000ML AR STRL (UROLOGICAL SUPPLIES) IMPLANT
CATH FOLEY 2WAY  5CC 16FR (CATHETERS)
CATH FOLEY 2WAY 5CC 16FR (CATHETERS)
CATH URTH 16FR FL 2W BLN LF (CATHETERS) IMPLANT
DEVICE MYOSURE LITE (MISCELLANEOUS) ×1 IMPLANT
DEVICE MYOSURE REACH (MISCELLANEOUS) ×1 IMPLANT
DRSG TELFA 3X8 NADH STRL (GAUZE/BANDAGES/DRESSINGS) IMPLANT
ELECT REM PT RETURN 9FT ADLT (ELECTROSURGICAL) ×1
ELECTRODE REM PT RTRN 9FT ADLT (ELECTROSURGICAL) ×1 IMPLANT
GLOVE BIO SURGEON STRL SZ7 (GLOVE) ×1 IMPLANT
GLOVE BIOGEL PI IND STRL 7.5 (GLOVE) ×1 IMPLANT
GLOVE BIOGEL PI INDICATOR 7.5 (GLOVE) ×1
GOWN STRL REUS W/ TWL LRG LVL3 (GOWN DISPOSABLE) ×2 IMPLANT
GOWN STRL REUS W/TWL LRG LVL3 (GOWN DISPOSABLE) ×2
IV NS IRRIG 3000ML ARTHROMATIC (IV SOLUTION) ×1 IMPLANT
KIT PROCEDURE FLUENT (KITS) ×1 IMPLANT
KIT TURNOVER CYSTO (KITS) ×1 IMPLANT
MANIFOLD NEPTUNE II (INSTRUMENTS) ×1 IMPLANT
PACK DNC HYST (MISCELLANEOUS) ×1 IMPLANT
PAD OB MATERNITY 4.3X12.25 (PERSONAL CARE ITEMS) ×1 IMPLANT
PAD PREP 24X41 OB/GYN DISP (PERSONAL CARE ITEMS) ×1 IMPLANT
SCRUB CHG 4% DYNA-HEX 4OZ (MISCELLANEOUS) ×1 IMPLANT
SEAL ROD LENS SCOPE MYOSURE (ABLATOR) ×1 IMPLANT
SET CYSTO W/LG BORE CLAMP LF (SET/KITS/TRAYS/PACK) IMPLANT
SURGILUBE 2OZ TUBE FLIPTOP (MISCELLANEOUS) ×1 IMPLANT
TRAP FLUID SMOKE EVACUATOR (MISCELLANEOUS) ×1 IMPLANT
TUBING CONNECTING 10 (TUBING) ×1 IMPLANT
WATER STERILE IRR 500ML POUR (IV SOLUTION) ×1 IMPLANT

## 2022-02-18 NOTE — Anesthesia Preprocedure Evaluation (Signed)
Anesthesia Evaluation  Patient identified by MRN, date of birth, ID band Patient awake    Reviewed: Allergy & Precautions, H&P , NPO status , Patient's Chart, lab work & pertinent test results  History of Anesthesia Complications Negative for: history of anesthetic complications  Airway Mallampati: II  TM Distance: >3 FB     Dental  (+) Teeth Intact   Pulmonary neg pulmonary ROS, neg shortness of breath, neg sleep apnea, neg COPD, neg recent URI, Not current smoker,    breath sounds clear to auscultation       Cardiovascular (-) angina(-) Past MI and (-) Cardiac Stents negative cardio ROS  (-) dysrhythmias  Rhythm:regular Rate:Normal     Neuro/Psych negative neurological ROS  negative psych ROS   GI/Hepatic negative GI ROS, Neg liver ROS,   Endo/Other  negative endocrine ROS  Renal/GU negative Renal ROS  negative genitourinary   Musculoskeletal   Abdominal   Peds  Hematology negative hematology ROS (+)   Anesthesia Other Findings Past Medical History: 2012, 2014: Breast cyst     Comment:  right breast No date: Carpal tunnel syndrome No date: Gout No date: Mastitis  Past Surgical History: 2012: BREAST CYST ASPIRATION; Left  BMI    Body Mass Index: 18.47 kg/m      Reproductive/Obstetrics negative OB ROS                             Anesthesia Physical  Anesthesia Plan  ASA: 1  Anesthesia Plan: General   Post-op Pain Management:    Induction: Intravenous  PONV Risk Score and Plan: Ondansetron, Dexamethasone, Midazolam and Treatment may vary due to age or medical condition  Airway Management Planned: LMA  Additional Equipment:   Intra-op Plan:   Post-operative Plan: Extubation in OR  Informed Consent: I have reviewed the patients History and Physical, chart, labs and discussed the procedure including the risks, benefits and alternatives for the proposed anesthesia  with the patient or authorized representative who has indicated his/her understanding and acceptance.     Dental Advisory Given  Plan Discussed with: Anesthesiologist, CRNA and Surgeon  Anesthesia Plan Comments:         Anesthesia Quick Evaluation

## 2022-02-18 NOTE — Interval H&P Note (Signed)
History and Physical Interval Note:  02/18/2022 10:55 AM  Patricia Mercado  has presented today for surgery, with the diagnosis of endometrial polyp, postmenopausal bleeding.  The various methods of treatment have been discussed with the patient and family. After consideration of risks, benefits and other options for treatment, the patient has consented to  Procedure(s): DILATATION AND CURETTAGE /HYSTEROSCOPY, POLYPECTOMY (N/A) as a surgical intervention.  The patient's history has been reviewed, patient examined, no change in status, stable for surgery.  I have reviewed the patient's chart and labs.  Questions were answered to the patient's satisfaction.  Consents reviewed and signed and patient wishes to proceed. To OR when ready.  Thomasene Mohair, MD, [facility] OB[facility]N 02/18/2022 10:56 AM

## 2022-02-18 NOTE — Discharge Instructions (Signed)
AMBULATORY SURGERY  ?DISCHARGE INSTRUCTIONS ? ? ?The drugs that you were given will stay in your system until tomorrow so for the next 24 hours you should not: ? ?Drive an automobile ?Make any legal decisions ?Drink any alcoholic beverage ? ? ?You may resume regular meals tomorrow.  Today it is better to start with liquids and gradually work up to solid foods. ? ?You may eat anything you prefer, but it is better to start with liquids, then soup and crackers, and gradually work up to solid foods. ? ? ?Please notify your doctor immediately if you have any unusual bleeding, trouble breathing, redness and pain at the surgery site, drainage, fever, or pain not relieved by medication. ? ? ? ?Additional Instructions: ? ? ? ?Please contact your physician with any problems or Same Day Surgery at [phone], Monday through Friday 6 am to 4 pm, or [place] [facility] [place] [facility] number at [phone].  ?

## 2022-02-18 NOTE — Op Note (Signed)
Operative Note   Patient Name: Patricia Mercado  Date of Service: 02/18/2022  DOB: 1966-04-30  MRN: [id number]   PRE-OP DIAGNOSIS:  1) Postmenopausal bleeding [N95.0] 2) Endometrial polyp [N84.0]   POST-OP DIAGNOSIS:  1) Postmenopausal bleeding [N95.0] 2) Endometrial polyp [N84.0]   SURGEON: Surgeon(s) and Role:    Conard Novak, MD - Primary  PROCEDURE: Procedure(s): DILATATION AND CURETTAGE /HYSTEROSCOPY, POLYPECTOMY   ANESTHESIA: Choice   ESTIMATED BLOOD LOSS: minimal  DRAINS: none   TOTAL IV FLUIDS: 600 mL  SPECIMENS:  1) Endometrial curettings with suspected endometrial polyp 2) Endocervical curettings  VTE PROPHYLAXIS: SCDs to the bilateral lower extremities  ANTIBIOTICS: none indicated  FLUID DEFICIT: minimal  COMPLICATIONS: none  DISPOSITION: PACU - hemodynamically stable.  CONDITION: stable  INDICATION: 56 y.o. [id number] female with postmenopausal bleeding. No obvious findings on ultrasound. Endometrial polyp on endometrial biopsy.  Normal pap smear.   FINDINGS: Exam under anesthesia revealed small, mobile midaxial uterus with no masses and bilateral adnexa without masses or fullness. Hysteroscopy revealed a grossly normal appearing uterine cavity with bilateral tubal ostia and normal appearing endocervical canal. The only abnormality noted was a small growth just lateral to the right tubal ostium. The estimated size of this is about 2-3 mm.   PROCEDURE IN DETAIL:  After informed consent was obtained, the patient was taken to the operating room where anesthesia was obtained without difficulty. The patient was positioned in the dorsal lithotomy position in [place] stirrups.  The patient's bladder was catheterized with an in and out foley catheter.  The patient was examined under anesthesia, with the above noted findings.  The bi-valved speculum was placed inside the patient's vagina, and the the anterior lip of the cervix was grasped with the tenaculum.  The  cervix was progressively dilated to a 7 mmm Hegar dilator.  The hysteroscope was introduced, with the above noted findings. The MyoSure Light device was utilized to remove the polypoid lesion near the right tubal ostium.    The hystersocope was removed and the uterine cavity was curetted until a gritty texture was noted, yielding small  endometrial curettings.  An endocervical curettage was also performed.  Excellent hemostasis was noted after removal of the tenaculum with application of silver nitrate to the tenaculum entry sites, and all instruments were removed, with excellent hemostasis noted throughout.  She was then taken out of dorsal lithotomy. The patient tolerated the procedure well.  Sponge, lap and needle counts were correct x2.  The patient was taken to recovery room in excellent condition.  Conard Novak, MD, FACOG 02/18/2022 11:42 AM

## 2022-02-18 NOTE — Anesthesia Procedure Notes (Signed)
Procedure Name: LMA Insertion Date/Time: 02/18/2022 11:07 AM  Performed by: Henrietta Hoover, CRNAPre-anesthesia Checklist: Patient identified, Emergency Drugs available, Patient being monitored and Suction available Patient Re-evaluated:Patient Re-evaluated prior to induction Oxygen Delivery Method: Circle system utilized Preoxygenation: Pre-oxygenation with 100% oxygen Induction Type: IV induction Ventilation: Mask ventilation without difficulty LMA: LMA inserted LMA Size: 3.0 Number of attempts: 1 Placement Confirmation: positive ETCO2 and breath sounds checked- equal and bilateral Tube secured with: Tape Dental Injury: Teeth and Oropharynx as per pre-operative assessment

## 2022-02-18 NOTE — Transfer of Care (Signed)
Immediate Anesthesia Transfer of Care Note  Patient: Patricia Mercado  Procedure(s) Performed: DILATATION AND CURETTAGE /HYSTEROSCOPY, POLYPECTOMY (Uterus)  Patient Location: PACU  Anesthesia Type:General  Level of Consciousness: awake, drowsy and patient cooperative  Airway & Oxygen Therapy: Patient Spontanous Breathing  Post-op Assessment: Report given to RN and Post -op Vital signs reviewed and stable  Post vital signs: Reviewed and stable  Last Vitals:  Vitals Value Taken Time  BP 129/81 02/18/22 1157  Temp 36.4 C 02/18/22 1157  Pulse 64 02/18/22 1200  Resp 12 02/18/22 1200  SpO2 100 % 02/18/22 1200  Vitals shown include unvalidated device data.  Last Pain:  Vitals:   02/18/22 1157  TempSrc:   PainSc: Asleep         Complications: No notable events documented.

## 2022-02-22 LAB — SURGICAL PATHOLOGY

## 2022-02-23 NOTE — Anesthesia Postprocedure Evaluation (Signed)
Anesthesia Post Note  Patient: Patricia Mercado  Procedure(s) Performed: DILATATION AND CURETTAGE /HYSTEROSCOPY, POLYPECTOMY (Uterus)  Patient location during evaluation: PACU Anesthesia Type: General Level of consciousness: awake and alert Pain management: pain level controlled Vital Signs Assessment: post-procedure vital signs reviewed and stable Respiratory status: spontaneous breathing, nonlabored ventilation, respiratory function stable and patient connected to nasal cannula oxygen Cardiovascular status: blood pressure returned to baseline and stable Postop Assessment: no apparent nausea or vomiting Anesthetic complications: no   No notable events documented.   Last Vitals:  Vitals:   02/18/22 1230 02/18/22 1245  BP: (!) 132/58 (!) 137/54  Pulse: (!) 55 65  Resp: 11 15  Temp: 36.4 C 36.6 C  SpO2: 98% 100%    Last Pain:  Vitals:   02/21/22 1053  TempSrc:   PainSc: 0-No pain                 Lenard Simmer

## 2022-12-19 ENCOUNTER — Other Ambulatory Visit: Payer: Self-pay | Admitting: Obstetrics and Gynecology

## 2022-12-19 DIAGNOSIS — Z1231 Encounter for screening mammogram for malignant neoplasm of breast: Secondary | ICD-10-CM

## 2023-01-20 ENCOUNTER — Ambulatory Visit
Admission: RE | Admit: 2023-01-20 | Discharge: 2023-01-20 | Disposition: A | Discharge status: Home / Self Care | Payer: BC Managed Care – PPO | Source: Ambulatory Visit | Attending: Obstetrics and Gynecology | Admitting: Obstetrics and Gynecology

## 2023-01-20 DIAGNOSIS — Z1231 Encounter for screening mammogram for malignant neoplasm of breast: Secondary | ICD-10-CM | POA: Insufficient documentation

## 2023-12-26 ENCOUNTER — Other Ambulatory Visit: Payer: Self-pay | Admitting: Obstetrics and Gynecology

## 2023-12-26 DIAGNOSIS — Z1231 Encounter for screening mammogram for malignant neoplasm of breast: Secondary | ICD-10-CM

## 2024-01-22 ENCOUNTER — Ambulatory Visit
Admission: RE | Admit: 2024-01-22 | Discharge: 2024-01-22 | Disposition: A | Discharge status: Home / Self Care | Payer: Self-pay | Source: Ambulatory Visit | Attending: Obstetrics and Gynecology | Admitting: Obstetrics and Gynecology

## 2024-01-22 DIAGNOSIS — Z1231 Encounter for screening mammogram for malignant neoplasm of breast: Secondary | ICD-10-CM | POA: Diagnosis present

## 2024-01-29 ENCOUNTER — Other Ambulatory Visit: Payer: Self-pay | Admitting: Obstetrics and Gynecology

## 2024-01-29 DIAGNOSIS — Z1231 Encounter for screening mammogram for malignant neoplasm of breast: Secondary | ICD-10-CM

## 2024-02-01 ENCOUNTER — Other Ambulatory Visit: Payer: Self-pay | Admitting: Obstetrics and Gynecology

## 2024-02-01 DIAGNOSIS — R928 Other abnormal and inconclusive findings on diagnostic imaging of breast: Secondary | ICD-10-CM

## 2024-02-02 ENCOUNTER — Ambulatory Visit
Admission: RE | Admit: 2024-02-02 | Discharge: 2024-02-02 | Disposition: A | Discharge status: Home / Self Care | Source: Ambulatory Visit | Attending: Obstetrics and Gynecology | Admitting: Obstetrics and Gynecology

## 2024-02-02 DIAGNOSIS — R928 Other abnormal and inconclusive findings on diagnostic imaging of breast: Secondary | ICD-10-CM | POA: Diagnosis present

## 2024-02-05 ENCOUNTER — Other Ambulatory Visit: Payer: Self-pay | Admitting: Obstetrics and Gynecology

## 2024-02-05 DIAGNOSIS — R928 Other abnormal and inconclusive findings on diagnostic imaging of breast: Secondary | ICD-10-CM

## 2024-02-06 ENCOUNTER — Inpatient Hospital Stay
Admission: RE | Admit: 2024-02-06 | Discharge: 2024-02-06 | Discharge status: Home / Self Care | Source: Ambulatory Visit | Attending: Obstetrics and Gynecology | Admitting: Obstetrics and Gynecology

## 2024-02-06 ENCOUNTER — Ambulatory Visit
Admission: RE | Admit: 2024-02-06 | Discharge: 2024-02-06 | Disposition: A | Discharge status: Home / Self Care | Source: Ambulatory Visit | Attending: Obstetrics and Gynecology | Admitting: Obstetrics and Gynecology

## 2024-02-06 DIAGNOSIS — N6031 Fibrosclerosis of right breast: Secondary | ICD-10-CM | POA: Diagnosis present

## 2024-02-06 DIAGNOSIS — R928 Other abnormal and inconclusive findings on diagnostic imaging of breast: Secondary | ICD-10-CM

## 2024-02-06 HISTORY — PX: BREAST BIOPSY: SHX20

## 2024-02-06 MED ORDER — LIDOCAINE 1 % OPTIME INJ - NO CHARGE
2.0000 mL | Freq: Once | INTRAMUSCULAR | Status: AC
  Administered 2024-02-06: 2 mL
  Filled 2024-02-06: qty 2

## 2024-02-06 MED ORDER — LIDOCAINE-EPINEPHRINE 1 %-1:100000 IJ SOLN
8.0000 mL | Freq: Once | INTRAMUSCULAR | Status: AC
  Administered 2024-02-06: 8 mL
  Filled 2024-02-06: qty 8

## 2024-02-07 LAB — SURGICAL PATHOLOGY
